# Patient Record
Sex: Female | Born: 1940 | Race: White | Hispanic: No | Marital: Married | Smoking: Never smoker
Health system: Southern US, Community
[De-identification: ages and names within clinical notes are randomized; demographics above are authoritative.]

## PROBLEM LIST (undated history)

## (undated) DIAGNOSIS — I10 Essential (primary) hypertension: Secondary | ICD-10-CM

## (undated) DIAGNOSIS — F419 Anxiety disorder, unspecified: Secondary | ICD-10-CM

## (undated) DIAGNOSIS — R011 Cardiac murmur, unspecified: Secondary | ICD-10-CM

## (undated) DIAGNOSIS — R42 Dizziness and giddiness: Secondary | ICD-10-CM

## (undated) DIAGNOSIS — IMO0001 Reserved for inherently not codable concepts without codable children: Secondary | ICD-10-CM

## (undated) DIAGNOSIS — G4730 Sleep apnea, unspecified: Secondary | ICD-10-CM

## (undated) DIAGNOSIS — A389 Scarlet fever, uncomplicated: Secondary | ICD-10-CM

## (undated) DIAGNOSIS — K219 Gastro-esophageal reflux disease without esophagitis: Secondary | ICD-10-CM

## (undated) DIAGNOSIS — I8390 Asymptomatic varicose veins of unspecified lower extremity: Secondary | ICD-10-CM

## (undated) DIAGNOSIS — Z5189 Encounter for other specified aftercare: Secondary | ICD-10-CM

## (undated) DIAGNOSIS — E7800 Pure hypercholesterolemia, unspecified: Secondary | ICD-10-CM

## (undated) DIAGNOSIS — M1990 Unspecified osteoarthritis, unspecified site: Secondary | ICD-10-CM

## (undated) HISTORY — PX: COLONOSCOPY WITH ESOPHAGOGASTRODUODENOSCOPY (EGD): SHX5779

---

## 1960-01-27 HISTORY — PX: OTHER SURGICAL HISTORY: SHX169

## 1982-01-26 HISTORY — PX: ABDOMINAL HYSTERECTOMY: SHX81

## 1998-02-18 ENCOUNTER — Other Ambulatory Visit: Admission: RE | Admit: 1998-02-18 | Discharge: 1998-02-18 | Payer: Self-pay | Admitting: Obstetrics and Gynecology

## 1999-06-02 ENCOUNTER — Other Ambulatory Visit: Admission: RE | Admit: 1999-06-02 | Discharge: 1999-06-02 | Payer: Self-pay | Admitting: Obstetrics and Gynecology

## 2000-09-20 ENCOUNTER — Other Ambulatory Visit: Admission: RE | Admit: 2000-09-20 | Discharge: 2000-09-20 | Payer: Self-pay | Admitting: Obstetrics and Gynecology

## 2000-12-28 ENCOUNTER — Ambulatory Visit (HOSPITAL_COMMUNITY): Admission: RE | Admit: 2000-12-28 | Discharge: 2000-12-28 | Payer: Self-pay | Admitting: Gastroenterology

## 2005-07-22 ENCOUNTER — Encounter: Admission: RE | Admit: 2005-07-22 | Discharge: 2005-07-22 | Payer: Self-pay | Admitting: Internal Medicine

## 2005-08-31 ENCOUNTER — Ambulatory Visit (HOSPITAL_COMMUNITY): Admission: RE | Admit: 2005-08-31 | Discharge: 2005-08-31 | Payer: Self-pay | Admitting: Internal Medicine

## 2005-09-01 ENCOUNTER — Encounter: Admission: RE | Admit: 2005-09-01 | Discharge: 2005-09-01 | Payer: Self-pay | Admitting: Internal Medicine

## 2010-04-27 HISTORY — PX: CERVICAL FUSION: SHX112

## 2010-04-29 ENCOUNTER — Telehealth: Payer: Self-pay | Admitting: Cardiology

## 2010-04-29 NOTE — Telephone Encounter (Signed)
WANTS TO KNOW DATES OF STRESS TEST AND ECHOGRAM

## 2010-04-30 ENCOUNTER — Telehealth: Payer: Self-pay | Admitting: *Deleted

## 2010-04-30 NOTE — Telephone Encounter (Signed)
Called wanting to know when she had her last Echo; and stress test. Echo was done in 04; and nuclear was done 05

## 2010-05-13 ENCOUNTER — Ambulatory Visit (HOSPITAL_COMMUNITY)
Admission: RE | Admit: 2010-05-13 | Discharge: 2010-05-13 | Disposition: A | Discharge status: Home / Self Care | Payer: Medicare Other | Source: Ambulatory Visit | Attending: Neurosurgery | Admitting: Neurosurgery

## 2010-05-13 ENCOUNTER — Encounter (HOSPITAL_COMMUNITY)
Admission: RE | Admit: 2010-05-13 | Discharge: 2010-05-13 | Disposition: A | Discharge status: Home / Self Care | Payer: Medicare Other | Source: Ambulatory Visit | Attending: Neurosurgery | Admitting: Neurosurgery

## 2010-05-13 ENCOUNTER — Other Ambulatory Visit (HOSPITAL_COMMUNITY): Payer: Self-pay | Admitting: Neurosurgery

## 2010-05-13 DIAGNOSIS — Z01811 Encounter for preprocedural respiratory examination: Secondary | ICD-10-CM

## 2010-05-13 DIAGNOSIS — Z01818 Encounter for other preprocedural examination: Secondary | ICD-10-CM | POA: Insufficient documentation

## 2010-05-13 DIAGNOSIS — Z01810 Encounter for preprocedural cardiovascular examination: Secondary | ICD-10-CM | POA: Insufficient documentation

## 2010-05-13 DIAGNOSIS — I517 Cardiomegaly: Secondary | ICD-10-CM | POA: Insufficient documentation

## 2010-05-13 DIAGNOSIS — Z01812 Encounter for preprocedural laboratory examination: Secondary | ICD-10-CM | POA: Insufficient documentation

## 2010-05-13 LAB — BASIC METABOLIC PANEL
BUN: 11 mg/dL (ref 6–23)
CO2: 28 mEq/L (ref 19–32)
Calcium: 9.9 mg/dL (ref 8.4–10.5)
Chloride: 104 mEq/L (ref 96–112)
Creatinine, Ser: 0.73 mg/dL (ref 0.4–1.2)
GFR calc Af Amer: >60 mL/min (ref >60)
GFR calc non Af Amer: >60 mL/min (ref >60)
Glucose, Bld: 93 mg/dL (ref 70–99)
Potassium: 4.4 mEq/L (ref 3.5–5.1)
Sodium: 139 mEq/L (ref 135–145)

## 2010-05-13 LAB — CBC
HCT: 38.4 % (ref 36.0–46.0)
Hemoglobin: 13 g/dL (ref 12.0–15.0)
MCH: 30.6 pg (ref 26.0–34.0)
MCHC: 33.9 g/dL (ref 30.0–36.0)
MCV: 90.4 fL (ref 78.0–100.0)
Platelets: 173 10*3/uL (ref 150–400)
RBC: 4.25 MIL/uL (ref 3.87–5.11)
RDW: 13.8 % (ref 11.5–15.5)
WBC: 7.9 10*3/uL (ref 4.0–10.5)

## 2010-05-13 LAB — SURGICAL PCR SCREEN
MRSA, PCR: NEGATIVE
Staphylococcus aureus: NEGATIVE

## 2010-05-21 ENCOUNTER — Inpatient Hospital Stay (HOSPITAL_COMMUNITY): Payer: Medicare Other

## 2010-05-21 ENCOUNTER — Inpatient Hospital Stay (HOSPITAL_COMMUNITY)
Admission: RE | Admit: 2010-05-21 | Discharge: 2010-05-22 | DRG: 472 | Disposition: A | Discharge status: Home / Self Care | Payer: Medicare Other | Source: Ambulatory Visit | Attending: Neurosurgery | Admitting: Neurosurgery

## 2010-05-21 DIAGNOSIS — M4716 Other spondylosis with myelopathy, lumbar region: Secondary | ICD-10-CM | POA: Diagnosis present

## 2010-05-21 DIAGNOSIS — J309 Allergic rhinitis, unspecified: Secondary | ICD-10-CM | POA: Diagnosis present

## 2010-05-21 DIAGNOSIS — M47812 Spondylosis without myelopathy or radiculopathy, cervical region: Principal | ICD-10-CM | POA: Diagnosis present

## 2010-05-21 DIAGNOSIS — M5030 Other cervical disc degeneration, unspecified cervical region: Secondary | ICD-10-CM | POA: Diagnosis present

## 2010-05-21 DIAGNOSIS — Q762 Congenital spondylolisthesis: Secondary | ICD-10-CM

## 2010-05-21 DIAGNOSIS — Z7982 Long term (current) use of aspirin: Secondary | ICD-10-CM

## 2010-05-21 DIAGNOSIS — Z79899 Other long term (current) drug therapy: Secondary | ICD-10-CM

## 2010-05-21 DIAGNOSIS — K219 Gastro-esophageal reflux disease without esophagitis: Secondary | ICD-10-CM | POA: Diagnosis present

## 2010-05-21 DIAGNOSIS — I10 Essential (primary) hypertension: Secondary | ICD-10-CM | POA: Diagnosis present

## 2010-05-24 NOTE — Op Note (Signed)
NAMEEMALY, BOSCHERT                 ACCOUNT NO.:  [id number]  MEDICAL RECORD NO.:  [id number]           PATIENT TYPE:  I  LOCATION:  3533                         FACILITY:  [facility]  PHYSICIAN:  Hewitt Shorts, M.D.DATE OF BIRTH:  17-Dec-1940  DATE OF PROCEDURE:  05/21/2010 DATE OF DISCHARGE:                              OPERATIVE REPORT   PREOPERATIVE DIAGNOSES: 1. Cervical spondylosis. 2. Cervical degenerative disk disease. 3. Cervical anterolisthesis. 4. Cervicalgia.  POSTOPERATIVE DIAGNOSES: 1. Cervical spondylosis. 2. Cervical degenerative disk disease. 3. Cervical anterolisthesis. 4. Cervicalgia.  PROCEDURE:  C4-5, C5-6, and C6-7 anterior cervical decompression arthrodesis with allograft and Tether cervical plating.  SURGEON:  Hewitt Shorts, MD  ASSISTANT:  Danae Orleans. Venetia Maxon, MD  ANESTHESIA:  General endotracheal.  INDICATIONS:  The patient is a 70 year old woman who presented with neck pain extending to the shoulders bilaterally.  She was found to have advanced degenerative disk disease and spondylosis at the C5-6 and C6-7 levels with disk space narrowing, ventral and dorsal spurring, but an anterolisthesis of C4-5 that was dynamic with flexion and extension. Decision was made to proceed with three-level anterior cervical decompression and stabilization.  PROCEDURE IN DETAIL:  The patient was brought to the operating room and placed in general endotracheal anesthesia.  The patient was placed in 10 pounds of Holter traction.  The neck was prepped with Betadine soap and solution, and draped in sterile fashion.  An oblique incision was made on the left side of the neck paralleling the anterior border of the left sternocleidomastoid.  The line of incision was then infiltrated with local anesthetic with epinephrine.  Incision was made and carried down through the subcutaneous tissue and platysma.  Bipolar cautery was used to maintain hemostasis.  Dissection was  then carried out through an avascular plane in the sternocleidomastoid, carotid artery, and jugular vein laterally and trachea and esophagus medially.  The ventral aspect of the vertebral column was identified and localized x-ray taken of the C4-5, C5-6, and C6-7 intervertebral disk space identified.  There was significant ventral spurring at the C5-6 and C6-7 levels.  This was removed using double-action rongeurs and the osteophyte removal tool. The disk spaces were quite narrowed at both C5-6 and C6-7 and the operating microscope was draped and brought in to the field to provide additional magnification, illumination, and visualization.  As we proceeded with diskectomy, there was little in the way of disk material at the C5-6 and C6-7 levels.  We began to remove the cartilaginous endplates at those levels using the micro curettes and high-speed drill and then posterior osteophytic overgrowth was removed using the high- speed drill and a 2-mm Kerrison punch with a thin footplate.  At C4-5, the diskectomy was performed using micro curettes and pituitary rongeurs.  The cartilaginous endplates were removed using micro curettes and the high-speed drill and posterior osteophytic overgrowth was removed using the high-speed drill and 2-mm Kerrison punch.  At each of the levels, the posterior longitudinal ligament, which was thickened and partially calcified was carefully removed, decompressed the spinal canal and thecal sac.  We then turned our attention to  the neural foramina to remove spondylotic encroachment of the neural foramina at each level bilaterally.  Once the decompression was completed, hemostasis was established with the use of Gelfoam soaked in thrombin, measured the height of the intervertebral disk space and selected three 5-mm in height allograft implants.  They were hydrated in saline solution and positioned in each of the intervertebral disk spaces and countersunk.  We  packed some Gelfoam lateral to each of the interbody implants and then we selected a 46-mm three-level Tether cervical plate.  This was positioned over the fusion construct and secured to the vertebra with 4 x 12-mm screws using a pair of fixed screws at C4, single fixed screws at C5 and C6, and a pair of variable screws at C7.  Each screw hole was started with a pilot hole and tapped as needed and then each of the screws was placed.  Once all 6 screws were in place, final tightening was performed.  The wound was irrigated with bacitracin solution.  Hemostasis was established and confirmed.  An x-ray was taken, showed the plates, grafts, screws all in position and the alignment was good, and then we proceeded with closure. Platysma was closed with interrupted inverted 2-0 undyed Vicryl sutures, subcutaneous and subcuticular were closed with interrupted inverted 3-0 undyed Vicryl sutures, skin was closed with Dermabond.  The procedure was tolerated well.  The estimated blood loss was 75 mL.  Sponge count was correct.  Following the surgery, the patient was taken out of traction, placed in a soft cervical collar to be reversed from the anesthetic, extubated, and transferred to the recovery room for further care.     Hewitt Shorts, M.D.     RWN/MEDQ  D:  05/21/2010  T:  05/22/2010  Job:  [id number]  Electronically Signed by Shirlean Kelly M.D. on 05/24/2010 08:07:08 AM

## 2011-01-30 ENCOUNTER — Other Ambulatory Visit: Payer: Self-pay | Admitting: Surgical

## 2011-01-30 NOTE — H&P (Signed)
Beth Burke  DOB: 1940-07-20  Date of Admission: 02/16/2011  Chief Complaint:  Right Knee Pain  History of Present Illness The patient is a 71 year old female who comes in today for a preoperative History and Physical. The patient is scheduled for a right total knee arthroplasty to be performed by Dr. Gus Rankin. Aluisio, MD at [facility] on February 16, 2011 . Please see the hospital record for complete dictated history and physical.  Allergies NKDA  Past Medical History Anxiety Disorder. currently on sertraline Gastroesophageal Reflux Disease. Prilosec prn Heart murmur High blood pressure. Benazepril and amlodipine Hypercholesterolemia. simvastatin Osteoarthritis Sleep Apnea. Diagnosed but not on CPAP Vertigo Varicose veins Right tibia fracture . age 61 Scarlet Fever. age 49  Past Surgical History Hysterectomy. partial (cancerous) Neck Disc Surgery im nailing rt tibia Spinal Fusion. neck  Family History Cancer. sister- salivary glands Congestive Heart Failure. mother Hypertension. mother and sister Osteoarthritis. mother Severe allergy. mother and sister Mother. Living. Father. Deceased. died in a car wreck at age 38 (was in good health) Brother 1. Deceased. age 62, myocardial infarction Children. Living. in good health  Social History Alcohol use. never consumed alcohol Children. 2 Current work status. working part time Drug/Alcohol Rehab (Currently). no Drug/Alcohol Rehab (Previously). no Exercise. Exercises rarely; does running / walking Illicit drug use. no Living situation. live with spouse Marital status. married Number of flights of stairs before winded. greater than 5 Pain Contract. no Tobacco / smoke exposure. no Tobacco use. Never smoker. never smoker Pregnant. no  Review of Systems General:Not Present- Chills, Fever, Night Sweats, Fatigue, Weight Loss and Memory Loss. Skin:Not Present- Hives,  Itching, Rash, Eczema and Lesions. HEENT:Not Present- Tinnitus, Headache, Double Vision, Visual Loss, Hearing Loss and Dentures. Respiratory:Not Present- Shortness of breath with exertion, Shortness of breath at rest, Allergies, Coughing up blood and Chronic Cough. Note:last CXR 04-2010 Cardiovascular:Present- Murmur. Not Present- Chest Pain, Racing/skipping heartbeats, Difficulty Breathing Lying Down, Swelling and Palpitations. Note:Last EKG 04-2010 Gastrointestinal:Not Present- Bloody Stool, Heartburn, Abdominal Pain, Vomiting, Nausea, Constipation, Diarrhea, Difficulty Swallowing, Jaundice and Loss of appetitie. Female Genitourinary:Not Present- Blood in Urine, Urinary frequency, Weak urinary stream, Discharge, Flank Pain, Incontinence, Painful Urination, Urgency, Urinary Retention and Urinating at Night. Musculoskeletal:Present- Joint Stiffness. Not Present- Muscle Weakness, Muscle Pain, Joint Swelling, Joint Pain, Back Pain, Morning Stiffness and Spasms. Neurological:Not Present- Tremor, Dizziness, Blackout spells, Paralysis, Difficulty with balance and Weakness. Psychiatric:Not Present- Insomnia.  Vitals Weight: 135 lb Height: 62 in Body Surface Area: 1.64 m Body Mass Index: 24.69 kg/m Pulse: 64 (Regular) Resp.: 16 (Unlabored) BP: 142/82 (Sitting, Left Arm, Standard)  Physical Exam The physical exam findings are as follows:  General Mental Status - Alert, cooperative and good historian. General Appearance- pleasant. Not in acute distress. Orientation- Oriented X3. Build & Nutrition- Well nourished and Well developed.  Head and Neck Head- normocephalic, atraumatic . Neck Global Assessment- supple. no bruit auscultated on the right and no bruit auscultated on the left.  Eye Pupil- Bilateral- PERRLA. Motion- Bilateral- EOMI.   Chest and Lung Exam Auscultation: Breath sounds:- clear at anterior chest wall and - clear at posterior chest  wall. Adventitious sounds:- No Adventitious sounds.  Cardiovascular Auscultation:Rhythm- Regular rate and rhythm. Heart Sounds- S1 WNL and S2 WNL. Murmurs & Other Heart Sounds:Auscultation of the heart reveals - Note: Faint aortic murmur on exam  Abdomen Palpation/Percussion:Tenderness- Abdomen is non-tender to palpation. Rigidity (guarding)- Abdomen is soft. Auscultation:Auscultation of the abdomen reveals - Bowel sounds normal.  Female Genitourinary  Not done, not pertinent to present illness  Musculoskeletal Right knee has baout 15 degrees varus deformity. Range is about 10 to 120. There is marked crepitus on range of motion. She is tender medial greater than lateral. There is no instability noted.  RADIOGRAPHS: X-rays show severe bone on bone change in the medial and patellofemoral compartments of her right knee.  Assessment & Plan Osteoarthritis Right  Knee (715.96)  At this point, the most predictable means of improving pain and function is total knee arthroplasty. The procedure, risks, potential complications and rehab course are discussed in detail and the patient elects to proceed.  The goals of this procedure are decreased pain and increased function. There is a high liklihood that both of these goals will be achieved.  Patient is for a Right Total Knee Arthroplasty by Dr. Lequita Halt.  Patient is planning to go home after surgery, where her daughter will be her caregiver.  Dimitri Ped, PA-C

## 2011-02-03 ENCOUNTER — Encounter (HOSPITAL_COMMUNITY): Payer: Self-pay | Admitting: Pharmacy Technician

## 2011-02-09 ENCOUNTER — Encounter (HOSPITAL_COMMUNITY): Payer: Self-pay

## 2011-02-09 ENCOUNTER — Encounter (HOSPITAL_COMMUNITY)
Admission: RE | Admit: 2011-02-09 | Discharge: 2011-02-09 | Disposition: A | Discharge status: Home / Self Care | Payer: Medicare Other | Source: Ambulatory Visit | Attending: Orthopedic Surgery | Admitting: Orthopedic Surgery

## 2011-02-09 HISTORY — DX: Gastro-esophageal reflux disease without esophagitis: K21.9

## 2011-02-09 HISTORY — DX: Anxiety disorder, unspecified: F41.9

## 2011-02-09 HISTORY — DX: Asymptomatic varicose veins of unspecified lower extremity: I83.90

## 2011-02-09 HISTORY — DX: Dizziness and giddiness: R42

## 2011-02-09 HISTORY — DX: Scarlet fever, uncomplicated: A38.9

## 2011-02-09 HISTORY — DX: Unspecified osteoarthritis, unspecified site: M19.90

## 2011-02-09 HISTORY — DX: Cardiac murmur, unspecified: R01.1

## 2011-02-09 HISTORY — DX: Sleep apnea, unspecified: G47.30

## 2011-02-09 HISTORY — DX: Essential (primary) hypertension: I10

## 2011-02-09 HISTORY — DX: Pure hypercholesterolemia, unspecified: E78.00

## 2011-02-09 LAB — URINALYSIS, ROUTINE W REFLEX MICROSCOPIC
Glucose, UA: NEGATIVE mg/dL
Protein, ur: NEGATIVE mg/dL
pH: 6.5 (ref 5.0–8.0)

## 2011-02-09 LAB — CBC
HCT: 36.9 % (ref 36.0–46.0)
MCH: 29.4 pg (ref 26.0–34.0)
MCV: 90.4 fL (ref 78.0–100.0)
Platelets: 170 10*3/uL (ref 150–400)
RDW: 13.6 % (ref 11.5–15.5)
WBC: 7.1 10*3/uL (ref 4.0–10.5)

## 2011-02-09 LAB — URINE MICROSCOPIC-ADD ON

## 2011-02-09 LAB — COMPREHENSIVE METABOLIC PANEL
ALT: 14 U/L (ref 0–35)
AST: 17 U/L (ref 0–37)
Alkaline Phosphatase: 45 U/L (ref 39–117)
CO2: 25 mEq/L (ref 19–32)
GFR calc Af Amer: 82 mL/min — ABNORMAL LOW (ref >90)
GFR calc non Af Amer: 71 mL/min — ABNORMAL LOW (ref >90)
Glucose, Bld: 94 mg/dL (ref 70–99)
Potassium: 4.6 mEq/L (ref 3.5–5.1)
Sodium: 141 mEq/L (ref 135–145)

## 2011-02-09 LAB — SURGICAL PCR SCREEN: MRSA, PCR: NEGATIVE

## 2011-02-09 NOTE — Patient Instructions (Addendum)
20 Beth Burke  02/09/2011   Your procedure is scheduled on:  02-16-2011  Report to [facility] Stay Center at 1000 AM.  Call this number if you have problems the morning of surgery: [phone]   Remember:   Do not eat food or drink liquids:After Midnight.  .  Take these medicines the morning of surgery with A SIP OF WATER: flonase nasal spray if needed, prilosec, zoloft, norvasc   Do not wear jewelry, make-up   Do not wear lotions, powders, or perfumes.    Do not bring valuables to the hospital.  Contacts, dentures or bridgework may not be worn into surgery.  Leave suitcase in the car. After surgery it may be brought to your room.  For patients admitted to the hospital, checkout time is 11:00 AM the day of discharge.     Special Instructions: CHG Shower Use Special Wash: 1/2 bottle night before surgery and 1/2 bottle morning of surgery.neck down avoid private area, no shaving 2 days before hibiclens showers   Please read over the following fact sheets that you were given: MRSA Information, blood fact sheet  Jasmine December Baker Kogler rn [facility] pre op nurse phone number call if needed

## 2011-02-09 NOTE — Pre-Procedure Instructions (Addendum)
ekg 02-03-2011 dr Waynard Edwards on chart Medical clearance note dr Waynard Edwards on chart Chest xray 05-13-2010 in epic

## 2011-02-16 ENCOUNTER — Inpatient Hospital Stay (HOSPITAL_COMMUNITY)
Admission: RE | Admit: 2011-02-16 | Discharge: 2011-02-19 | DRG: 470 | Disposition: A | Discharge status: Home Health | Payer: Medicare Other | Source: Ambulatory Visit | Attending: Orthopedic Surgery | Admitting: Orthopedic Surgery

## 2011-02-16 ENCOUNTER — Inpatient Hospital Stay (HOSPITAL_COMMUNITY): Payer: Medicare Other | Admitting: *Deleted

## 2011-02-16 ENCOUNTER — Encounter (HOSPITAL_COMMUNITY)
Admission: RE | Disposition: A | Discharge status: Home Health | Payer: Self-pay | Source: Ambulatory Visit | Attending: Orthopedic Surgery

## 2011-02-16 ENCOUNTER — Encounter (HOSPITAL_COMMUNITY): Payer: Self-pay | Admitting: *Deleted

## 2011-02-16 DIAGNOSIS — K219 Gastro-esophageal reflux disease without esophagitis: Secondary | ICD-10-CM | POA: Diagnosis present

## 2011-02-16 DIAGNOSIS — R32 Unspecified urinary incontinence: Secondary | ICD-10-CM | POA: Diagnosis present

## 2011-02-16 DIAGNOSIS — E7800 Pure hypercholesterolemia, unspecified: Secondary | ICD-10-CM | POA: Diagnosis present

## 2011-02-16 DIAGNOSIS — Z9289 Personal history of other medical treatment: Secondary | ICD-10-CM

## 2011-02-16 DIAGNOSIS — IMO0002 Reserved for concepts with insufficient information to code with codable children: Principal | ICD-10-CM | POA: Diagnosis present

## 2011-02-16 DIAGNOSIS — M1710 Unilateral primary osteoarthritis, unspecified knee: Secondary | ICD-10-CM | POA: Diagnosis present

## 2011-02-16 DIAGNOSIS — Z01810 Encounter for preprocedural cardiovascular examination: Secondary | ICD-10-CM

## 2011-02-16 DIAGNOSIS — M21169 Varus deformity, not elsewhere classified, unspecified knee: Secondary | ICD-10-CM | POA: Diagnosis present

## 2011-02-16 DIAGNOSIS — F411 Generalized anxiety disorder: Secondary | ICD-10-CM | POA: Diagnosis present

## 2011-02-16 DIAGNOSIS — E876 Hypokalemia: Secondary | ICD-10-CM | POA: Diagnosis not present

## 2011-02-16 DIAGNOSIS — Z01812 Encounter for preprocedural laboratory examination: Secondary | ICD-10-CM

## 2011-02-16 DIAGNOSIS — Z01818 Encounter for other preprocedural examination: Secondary | ICD-10-CM

## 2011-02-16 DIAGNOSIS — R011 Cardiac murmur, unspecified: Secondary | ICD-10-CM | POA: Diagnosis present

## 2011-02-16 DIAGNOSIS — Z96659 Presence of unspecified artificial knee joint: Secondary | ICD-10-CM

## 2011-02-16 DIAGNOSIS — I10 Essential (primary) hypertension: Secondary | ICD-10-CM | POA: Diagnosis present

## 2011-02-16 DIAGNOSIS — I8390 Asymptomatic varicose veins of unspecified lower extremity: Secondary | ICD-10-CM | POA: Diagnosis present

## 2011-02-16 DIAGNOSIS — D62 Acute posthemorrhagic anemia: Secondary | ICD-10-CM | POA: Diagnosis not present

## 2011-02-16 DIAGNOSIS — G4730 Sleep apnea, unspecified: Secondary | ICD-10-CM | POA: Diagnosis present

## 2011-02-16 HISTORY — PX: TOTAL KNEE ARTHROPLASTY: SHX125

## 2011-02-16 LAB — TYPE AND SCREEN
ABO/RH(D): A POS
Antibody Screen: NEGATIVE
Unit division: 0

## 2011-02-16 LAB — ABO/RH: ABO/RH(D): A POS

## 2011-02-16 SURGERY — ARTHROPLASTY, KNEE, TOTAL
Anesthesia: General | Site: Knee | Laterality: Right | Wound class: Clean

## 2011-02-16 MED ORDER — BUPIVACAINE 0.25 % ON-Q PUMP SINGLE CATH 300ML
300.0000 mL | INJECTION | Status: DC
  Filled 2011-02-16: qty 300

## 2011-02-16 MED ORDER — PROPOFOL 10 MG/ML IV EMUL
INTRAVENOUS | Status: DC | PRN
  Administered 2011-02-16: 100 ug/kg/min via INTRAVENOUS

## 2011-02-16 MED ORDER — METHOCARBAMOL 500 MG PO TABS
500.0000 mg | ORAL_TABLET | Freq: Four times a day (QID) | ORAL | Status: DC | PRN
  Administered 2011-02-16 – 2011-02-18 (×3): 500 mg via ORAL
  Filled 2011-02-16 (×3): qty 1

## 2011-02-16 MED ORDER — METOCLOPRAMIDE HCL 5 MG/ML IJ SOLN: 5.0000 mg | Freq: Three times a day (TID) | INTRAMUSCULAR | Status: DC | PRN

## 2011-02-16 MED ORDER — FLEET ENEMA 7-19 GM/118ML RE ENEM: 1.0000 | ENEMA | Freq: Once | RECTAL | Status: AC | PRN

## 2011-02-16 MED ORDER — ACETAMINOPHEN 10 MG/ML IV SOLN
1000.0000 mg | Freq: Four times a day (QID) | INTRAVENOUS | Status: AC
  Administered 2011-02-16 – 2011-02-17 (×4): 1000 mg via INTRAVENOUS
  Filled 2011-02-16 (×5): qty 100

## 2011-02-16 MED ORDER — POLYVINYL ALCOHOL 1.4 % OP SOLN
1.0000 [drp] | Freq: Every evening | OPHTHALMIC | Status: DC
  Administered 2011-02-18: 1 [drp] via OPHTHALMIC
  Filled 2011-02-16: qty 15

## 2011-02-16 MED ORDER — ONDANSETRON HCL 4 MG/2ML IJ SOLN: 4.0000 mg | Freq: Four times a day (QID) | INTRAMUSCULAR | Status: DC | PRN

## 2011-02-16 MED ORDER — RIVAROXABAN 10 MG PO TABS
10.0000 mg | ORAL_TABLET | Freq: Every day | ORAL | Status: DC
  Administered 2011-02-17 – 2011-02-19 (×3): 10 mg via ORAL
  Filled 2011-02-16 (×3): qty 1

## 2011-02-16 MED ORDER — MORPHINE SULFATE (PF) 1 MG/ML IV SOLN
INTRAVENOUS | Status: DC
  Administered 2011-02-16: 15:00:00 via INTRAVENOUS
  Administered 2011-02-16: 1 mg via INTRAVENOUS
  Administered 2011-02-16: 6 mg via INTRAVENOUS
  Administered 2011-02-17: 1 mg via INTRAVENOUS
  Filled 2011-02-16: qty 25

## 2011-02-16 MED ORDER — SODIUM CHLORIDE 0.9 % IJ SOLN: 9.0000 mL | INTRAMUSCULAR | Status: DC | PRN

## 2011-02-16 MED ORDER — ACETAMINOPHEN 325 MG PO TABS: 650.0000 mg | ORAL_TABLET | Freq: Four times a day (QID) | ORAL | Status: DC | PRN

## 2011-02-16 MED ORDER — HYPROMELLOSE (GONIOSCOPIC) 2.5 % OP SOLN: 1.0000 [drp] | Freq: Every evening | OPHTHALMIC | Status: DC

## 2011-02-16 MED ORDER — LACTATED RINGERS IV SOLN
INTRAVENOUS | Status: DC
  Administered 2011-02-16 (×3): via INTRAVENOUS
  Administered 2011-02-16: 1000 mL via INTRAVENOUS

## 2011-02-16 MED ORDER — POLYETHYLENE GLYCOL 3350 17 G PO PACK
17.0000 g | PACK | Freq: Every day | ORAL | Status: DC | PRN
  Filled 2011-02-16: qty 1

## 2011-02-16 MED ORDER — SODIUM CHLORIDE 0.9 % IR SOLN
Status: DC | PRN
  Administered 2011-02-16: 1000 mL

## 2011-02-16 MED ORDER — PROMETHAZINE HCL 25 MG/ML IJ SOLN: 6.2500 mg | INTRAMUSCULAR | Status: DC | PRN

## 2011-02-16 MED ORDER — BUPIVACAINE ON-Q PAIN PUMP (FOR ORDER SET NO CHG)
INJECTION | Status: DC
  Filled 2011-02-16: qty 1

## 2011-02-16 MED ORDER — EPHEDRINE SULFATE 50 MG/ML IJ SOLN
INTRAMUSCULAR | Status: DC | PRN
  Administered 2011-02-16 (×2): 5 mg via INTRAVENOUS
  Administered 2011-02-16 (×2): 10 mg via INTRAVENOUS
  Administered 2011-02-16: 5 mg via INTRAVENOUS

## 2011-02-16 MED ORDER — CEFAZOLIN SODIUM 1-5 GM-% IV SOLN
1.0000 g | Freq: Four times a day (QID) | INTRAVENOUS | Status: AC
  Administered 2011-02-16 – 2011-02-17 (×3): 1 g via INTRAVENOUS
  Filled 2011-02-16 (×3): qty 50

## 2011-02-16 MED ORDER — ACETAMINOPHEN 10 MG/ML IV SOLN
INTRAVENOUS | Status: DC | PRN
  Administered 2011-02-16: 1000 mg via INTRAVENOUS

## 2011-02-16 MED ORDER — ONDANSETRON HCL 4 MG PO TABS: 4.0000 mg | ORAL_TABLET | Freq: Four times a day (QID) | ORAL | Status: DC | PRN

## 2011-02-16 MED ORDER — PANTOPRAZOLE SODIUM 40 MG PO TBEC
40.0000 mg | DELAYED_RELEASE_TABLET | Freq: Every day | ORAL | Status: DC
  Administered 2011-02-16 – 2011-02-17 (×2): 40 mg via ORAL
  Filled 2011-02-16 (×2): qty 1

## 2011-02-16 MED ORDER — DIPHENHYDRAMINE HCL 12.5 MG/5ML PO ELIX: 12.5000 mg | ORAL_SOLUTION | ORAL | Status: DC | PRN

## 2011-02-16 MED ORDER — LORATADINE 10 MG PO TABS
10.0000 mg | ORAL_TABLET | Freq: Every day | ORAL | Status: DC
  Administered 2011-02-16 – 2011-02-19 (×3): 10 mg via ORAL
  Filled 2011-02-16 (×4): qty 1

## 2011-02-16 MED ORDER — NALOXONE HCL 0.4 MG/ML IJ SOLN: 0.4000 mg | INTRAMUSCULAR | Status: DC | PRN

## 2011-02-16 MED ORDER — ONDANSETRON HCL 4 MG/2ML IJ SOLN
4.0000 mg | Freq: Four times a day (QID) | INTRAMUSCULAR | Status: DC | PRN
  Administered 2011-02-17: 4 mg via INTRAVENOUS
  Filled 2011-02-16: qty 2

## 2011-02-16 MED ORDER — METOCLOPRAMIDE HCL 10 MG PO TABS: 5.0000 mg | ORAL_TABLET | Freq: Three times a day (TID) | ORAL | Status: DC | PRN

## 2011-02-16 MED ORDER — DIPHENHYDRAMINE HCL 12.5 MG/5ML PO ELIX: 12.5000 mg | ORAL_SOLUTION | Freq: Four times a day (QID) | ORAL | Status: DC | PRN

## 2011-02-16 MED ORDER — DIPHENHYDRAMINE HCL 50 MG/ML IJ SOLN: 12.5000 mg | Freq: Four times a day (QID) | INTRAMUSCULAR | Status: DC | PRN

## 2011-02-16 MED ORDER — MIDAZOLAM HCL 5 MG/5ML IJ SOLN
INTRAMUSCULAR | Status: DC | PRN
  Administered 2011-02-16: 2 mg via INTRAVENOUS

## 2011-02-16 MED ORDER — CEFAZOLIN SODIUM 1-5 GM-% IV SOLN
1.0000 g | Freq: Once | INTRAVENOUS | Status: AC
  Administered 2011-02-16: 1 g via INTRAVENOUS

## 2011-02-16 MED ORDER — BUPIVACAINE 0.25 % ON-Q PUMP SINGLE CATH 300ML
INJECTION | Status: DC | PRN
  Administered 2011-02-16: 300 mL

## 2011-02-16 MED ORDER — SERTRALINE HCL 100 MG PO TABS
100.0000 mg | ORAL_TABLET | Freq: Every day | ORAL | Status: DC
  Administered 2011-02-17 – 2011-02-19 (×3): 100 mg via ORAL
  Filled 2011-02-16 (×3): qty 1

## 2011-02-16 MED ORDER — FLUTICASONE PROPIONATE 50 MCG/ACT NA SUSP: 2.0000 | Freq: Every day | NASAL | Status: DC | PRN

## 2011-02-16 MED ORDER — SODIUM CHLORIDE 0.9 % IV SOLN
INTRAVENOUS | Status: DC
  Administered 2011-02-16 – 2011-02-17 (×2): via INTRAVENOUS

## 2011-02-16 MED ORDER — DOCUSATE SODIUM 100 MG PO CAPS
100.0000 mg | ORAL_CAPSULE | Freq: Two times a day (BID) | ORAL | Status: DC
  Administered 2011-02-16 – 2011-02-19 (×6): 100 mg via ORAL
  Filled 2011-02-16 (×7): qty 1

## 2011-02-16 MED ORDER — SIMVASTATIN 10 MG PO TABS
10.0000 mg | ORAL_TABLET | Freq: Every day | ORAL | Status: DC
  Administered 2011-02-16 – 2011-02-18 (×3): 10 mg via ORAL
  Filled 2011-02-16 (×4): qty 1

## 2011-02-16 MED ORDER — AMLODIPINE BESYLATE 5 MG PO TABS
5.0000 mg | ORAL_TABLET | Freq: Every day | ORAL | Status: DC
  Administered 2011-02-17 – 2011-02-19 (×3): 5 mg via ORAL
  Filled 2011-02-16 (×3): qty 1

## 2011-02-16 MED ORDER — METHOCARBAMOL 100 MG/ML IJ SOLN
500.0000 mg | Freq: Four times a day (QID) | INTRAVENOUS | Status: DC | PRN
  Administered 2011-02-17: 500 mg via INTRAVENOUS
  Filled 2011-02-16 (×2): qty 5

## 2011-02-16 MED ORDER — TEMAZEPAM 15 MG PO CAPS: 15.0000 mg | ORAL_CAPSULE | Freq: Every evening | ORAL | Status: DC | PRN

## 2011-02-16 MED ORDER — MENTHOL 3 MG MT LOZG: 1.0000 | LOZENGE | OROMUCOSAL | Status: DC | PRN

## 2011-02-16 MED ORDER — HYDROMORPHONE HCL PF 1 MG/ML IJ SOLN: 0.2500 mg | INTRAMUSCULAR | Status: DC | PRN

## 2011-02-16 MED ORDER — ONDANSETRON HCL 4 MG/2ML IJ SOLN
INTRAMUSCULAR | Status: DC | PRN
  Administered 2011-02-16: 4 mg via INTRAVENOUS

## 2011-02-16 MED ORDER — PHENOL 1.4 % MT LIQD: 1.0000 | OROMUCOSAL | Status: DC | PRN

## 2011-02-16 MED ORDER — ACETAMINOPHEN 650 MG RE SUPP: 650.0000 mg | Freq: Four times a day (QID) | RECTAL | Status: DC | PRN

## 2011-02-16 MED ORDER — FENTANYL CITRATE 0.05 MG/ML IJ SOLN
INTRAMUSCULAR | Status: DC | PRN
  Administered 2011-02-16 (×2): 50 ug via INTRAVENOUS

## 2011-02-16 MED ORDER — BISACODYL 10 MG RE SUPP: 10.0000 mg | Freq: Every day | RECTAL | Status: DC | PRN

## 2011-02-16 MED ORDER — OXYCODONE HCL 5 MG PO TABS
5.0000 mg | ORAL_TABLET | ORAL | Status: DC | PRN
  Administered 2011-02-17 (×3): 10 mg via ORAL
  Administered 2011-02-17: 5 mg via ORAL
  Administered 2011-02-18 – 2011-02-19 (×4): 10 mg via ORAL
  Filled 2011-02-16 (×4): qty 2
  Filled 2011-02-16: qty 1
  Filled 2011-02-16 (×3): qty 2

## 2011-02-16 SURGICAL SUPPLY — 53 items
BAG SPEC THK2 15X12 ZIP CLS (MISCELLANEOUS) ×1
BAG ZIPLOCK 12X15 (MISCELLANEOUS) ×2 IMPLANT
BANDAGE ELASTIC 6 VELCRO ST LF (GAUZE/BANDAGES/DRESSINGS) ×2 IMPLANT
BANDAGE ESMARK 6X9 LF (GAUZE/BANDAGES/DRESSINGS) ×1 IMPLANT
BLADE SAG 18X100X1.27 (BLADE) ×2 IMPLANT
BLADE SAW SGTL 11.0X1.19X90.0M (BLADE) ×2 IMPLANT
BNDG CMPR 9X6 STRL LF SNTH (GAUZE/BANDAGES/DRESSINGS) ×1
BNDG ESMARK 6X9 LF (GAUZE/BANDAGES/DRESSINGS) ×2
BOWL SMART MIX CTS (DISPOSABLE) ×2 IMPLANT
CATH KIT ON-Q SILVERSOAK 5 (CATHETERS) ×1 IMPLANT
CATH KIT ON-Q SILVERSOAK 5IN (CATHETERS) ×2 IMPLANT
CEMENT HV SMART SET (Cement) ×3 IMPLANT
CLOTH BEACON ORANGE TIMEOUT ST (SAFETY) ×2 IMPLANT
CUFF TOURN SGL QUICK 34 (TOURNIQUET CUFF) ×2
CUFF TRNQT CYL 34X4X40X1 (TOURNIQUET CUFF) ×1 IMPLANT
DRAPE EXTREMITY T 121X128X90 (DRAPE) ×2 IMPLANT
DRAPE POUCH INSTRU U-SHP 10X18 (DRAPES) ×2 IMPLANT
DRAPE U-SHAPE 47X51 STRL (DRAPES) ×2 IMPLANT
DRSG ADAPTIC 3X8 NADH LF (GAUZE/BANDAGES/DRESSINGS) ×2 IMPLANT
DRSG PAD ABDOMINAL 8X10 ST (GAUZE/BANDAGES/DRESSINGS) ×1 IMPLANT
DURAPREP 26ML APPLICATOR (WOUND CARE) ×2 IMPLANT
ELECT REM PT RETURN 9FT ADLT (ELECTROSURGICAL) ×2
ELECTRODE REM PT RTRN 9FT ADLT (ELECTROSURGICAL) ×1 IMPLANT
EVACUATOR 1/8 PVC DRAIN (DRAIN) ×2 IMPLANT
FACESHIELD LNG OPTICON STERILE (SAFETY) ×10 IMPLANT
GLOVE BIO SURGEON STRL SZ7.5 (GLOVE) ×2 IMPLANT
GLOVE BIO SURGEON STRL SZ8 (GLOVE) ×2 IMPLANT
GLOVE BIOGEL PI IND STRL 8 (GLOVE) ×2 IMPLANT
GLOVE BIOGEL PI INDICATOR 8 (GLOVE) ×2
GOWN STRL NON-REIN LRG LVL3 (GOWN DISPOSABLE) ×2 IMPLANT
GOWN STRL REIN XL XLG (GOWN DISPOSABLE) ×2 IMPLANT
HANDPIECE INTERPULSE COAX TIP (DISPOSABLE) ×2
IMMOBILIZER KNEE 20 (SOFTGOODS) ×2
IMMOBILIZER KNEE 20 THIGH 36 (SOFTGOODS) ×1 IMPLANT
KIT BASIN OR (CUSTOM PROCEDURE TRAY) ×2 IMPLANT
MANIFOLD NEPTUNE II (INSTRUMENTS) ×2 IMPLANT
NS IRRIG 1000ML POUR BTL (IV SOLUTION) ×2 IMPLANT
PACK TOTAL JOINT (CUSTOM PROCEDURE TRAY) ×2 IMPLANT
PAD ABD 7.5X8 STRL (GAUZE/BANDAGES/DRESSINGS) ×2 IMPLANT
PADDING CAST COTTON 6X4 STRL (CAST SUPPLIES) ×6 IMPLANT
POSITIONER SURGICAL ARM (MISCELLANEOUS) ×2 IMPLANT
SET HNDPC FAN SPRY TIP SCT (DISPOSABLE) ×1 IMPLANT
SPONGE GAUZE 4X4 12PLY (GAUZE/BANDAGES/DRESSINGS) ×2 IMPLANT
STRIP CLOSURE SKIN 1/2X4 (GAUZE/BANDAGES/DRESSINGS) ×4 IMPLANT
SUCTION FRAZIER 12FR DISP (SUCTIONS) ×2 IMPLANT
SUT MNCRL AB 4-0 PS2 18 (SUTURE) ×2 IMPLANT
SUT PDS AB 1 CT1 27 (SUTURE) ×6 IMPLANT
SUT VIC AB 2-0 CT1 27 (SUTURE) ×6
SUT VIC AB 2-0 CT1 TAPERPNT 27 (SUTURE) ×3 IMPLANT
TOWEL OR 17X26 10 PK STRL BLUE (TOWEL DISPOSABLE) ×4 IMPLANT
TRAY FOLEY CATH 14FRSI W/METER (CATHETERS) ×2 IMPLANT
WATER STERILE IRR 1500ML POUR (IV SOLUTION) ×2 IMPLANT
WRAP KNEE MAXI GEL POST OP (GAUZE/BANDAGES/DRESSINGS) ×3 IMPLANT

## 2011-02-16 NOTE — Op Note (Signed)
Pre-operative diagnosis- Osteoarthritis  Right knee(s)  Post-operative diagnosis- Osteoarthritis Right knee(s)  Procedure-  Right  Total Knee Arthroplasty  Surgeon- Gus Rankin. Kathey Simer, MD  Assistant- Dimitri Ped, PA-C   Anesthesia-  Spinal EBL-* No blood loss amount entered *  Drains Hemovac  Tourniquet time-  Total Tourniquet Time Documented: Thigh (Right) - 35 minutes   Complications- None  Condition-PACU - hemodynamically stable.   Brief Clinical Note  Beth Burke is a 71 y.o. year old female with end stage OA of her right knee with progressively worsening pain and dysfunction. She has constant pain, with activity and at rest and significant functional deficits with difficulties even with ADLs. She has had extensive non-op management including analgesics, injections of cortisone and viscosupplements, and home exercise program, but remains in significant pain with significant dysfunction.Radiographs show bone on bone arthritis medial and patellofemoral with varus deformity and tibial subluxation. She presents now for left Total Knee Arthroplasty.    Procedure in detail---   The patient is brought into the operating room and positioned supine on the operating table. After successful administration of  Spinal,   a tourniquet is placed high on the  Right thigh(s) and the lower extremity is prepped and draped in the usual sterile fashion. Time out is performed by the operating team and then the  Right lower extremity is wrapped in Esmarch, knee flexed and the tourniquet inflated to 300 mmHg.       A midline incision is made with a ten blade through the subcutaneous tissue to the level of the extensor mechanism. A fresh blade is used to make a medial parapatellar arthrotomy. Soft tissue over the proximal medial tibia is subperiosteally elevated to the joint line with a knife and into the semimembranosus bursa with a Cobb elevator. Soft tissue over the proximal lateral tibia is elevated  with attention being paid to avoiding the patellar tendon on the tibial tubercle. The patella is everted, knee flexed 90 degrees and the ACL and PCL are removed. Findings are bone on bone medial and patellofemoral with large medial tibial osteophytes.        The drill is used to create a starting hole in the distal femur and the canal is thoroughly irrigated with sterile saline to remove the fatty contents. The 5 degree Right  valgus alignment guide is placed into the femoral canal and the distal femoral cutting block is pinned to remove 11 mm off the distal femur. Resection is made with an oscillating saw.      The tibia is subluxed forward and the menisci are removed. The extramedullary alignment guide is placed referencing proximally at the medial aspect of the tibial tubercle and distally along the second metatarsal axis and tibial crest. The block is pinned to remove 2mm off the more deficient medial  side. Resection is made with an oscillating saw. Size 2.5is the most appropriate size for the tibia and the proximal tibia is prepared with the modular drill and keel punch for that size.      The femoral sizing guide is placed and size 2.5 is most appropriate. Rotation is marked off the epicondylar axis and confirmed by creating a rectangular flexion gap at 90 degrees. The size 2.5 cutting block is pinned in this rotation and the anterior, posterior and chamfer cuts are made with the oscillating saw. The intercondylar block is then placed and that cut is made.      Trial size 2.5 tibial component, trial size 2.5 posterior  stabilized femur and a 12.5  mm posterior stabilized rotating platform insert trial is placed. Full extension is achieved with excellent varus/valgus and anterior/posterior balance throughout full range of motion. The patella is everted and thickness measured to be 22  mm. Free hand resection is taken to 12 mm, a 38 template is placed, lug holes are drilled, trial patella is placed, and it  tracks normally. Osteophytes are removed off the posterior femur with the trial in place. All trials are removed and the cut bone surfaces prepared with pulsatile lavage. Cement is mixed and once ready for implantation, the size 2.5 tibial implant, size  2.5 posterior stabilized femoral component, and the size 38 patella are cemented in place and the patella is held with the clamp. The trial insert is placed and the knee held in full extension. All extruded cement is removed and once the cement is hard the permanent 12.5 mm posterior stabilized rotating platform insert is placed into the tibial tray.      The wound is copiously irrigated with saline solution and the extensor mechanism closed over a hemovac drain with #1 PDS suture. The tourniquet is released for a total tourniquet time of 35  minutes. Flexion against gravity is 140 degrees and the patella tracks normally. Subcutaneous tissue is closed with 2.0 vicryl and subcuticular with running 4.0 Monocryl. The catheter for the Marcaine pain pump is placed and the pump is initiated. The incision is cleaned and dried and steri-strips and a bulky sterile dressing are applied. The limb is placed into a knee immobilizer and the patient is awakened and transported to recovery in stable condition.      Please note that a surgical assistant was a medical necessity for this procedure in order to perform it in a safe and expeditious manner. Surgical assistant was necessary to retract the ligaments and vital neurovascular structures to prevent injury to them and also necessary for proper positioning of the limb to allow for anatomic placement of the prosthesis.   Gus Rankin Beth Defino, MD    02/16/2011, 2:03 PM

## 2011-02-16 NOTE — Plan of Care (Signed)
Problem: Consults Goal: Diagnosis- Total Joint Replacement Outcome: Completed/Met Date Met:  02/16/11 Primary Total Knee Right

## 2011-02-16 NOTE — Anesthesia Preprocedure Evaluation (Addendum)
Anesthesia Evaluation  Patient identified by MRN, date of birth, ID band Patient awake    Reviewed: Allergy & Precautions, H&P , NPO status , Patient's Chart, lab work & pertinent test results, reviewed documented beta blocker date and time   Airway Mallampati: II TM Distance: >3 FB Neck ROM: Full    Dental  (+) Teeth Intact   Pulmonary sleep apnea ,  No Rx indicated clear to auscultation        Cardiovascular hypertension, Pt. on medications Regular Normal Denies cardiac symptoms   Neuro/Psych Negative Neurological ROS  Negative Psych ROS   GI/Hepatic negative GI ROS, Neg liver ROS,   Endo/Other  Negative Endocrine ROS  Renal/GU negative Renal ROS  Genitourinary negative   Musculoskeletal negative musculoskeletal ROS (+)   Abdominal   Peds negative pediatric ROS (+)  Hematology negative hematology ROS (+)   Anesthesia Other Findings Upper left side bridge  Reproductive/Obstetrics negative OB ROS                           Anesthesia Physical Anesthesia Plan  ASA: II  Anesthesia Plan: Spinal   Post-op Pain Management:    Induction: Intravenous  Airway Management Planned: Mask  Additional Equipment:   Intra-op Plan:   Post-operative Plan:   Informed Consent: I have reviewed the patients History and Physical, chart, labs and discussed the procedure including the risks, benefits and alternatives for the proposed anesthesia with the patient or authorized representative who has indicated his/her understanding and acceptance.     Plan Discussed with: CRNA and Surgeon  Anesthesia Plan Comments:        Anesthesia Quick Evaluation

## 2011-02-16 NOTE — Anesthesia Postprocedure Evaluation (Signed)
  Anesthesia Post-op Note  Patient: Beth Burke  Procedure(s) Performed:  TOTAL KNEE ARTHROPLASTY  Patient Location: PACU  Anesthesia Type: Spinal  Level of Consciousness: oriented and sedated  Airway and Oxygen Therapy: Patient Spontanous Breathing and Patient connected to nasal cannula oxygen  Post-op Pain: none  Post-op Assessment: Post-op Vital signs reviewed, Patient's Cardiovascular Status Stable, Respiratory Function Stable and Patent Airway  Post-op Vital Signs: stable  Complications: No apparent anesthesia complications

## 2011-02-16 NOTE — Anesthesia Procedure Notes (Signed)
Spinal  Patient location during procedure: OR Start time: 02/16/2011 12:48 PM End time: 02/16/2011 12:55 PM Staffing Anesthesiologist: Lestine Box B Performed by: anesthesiologist  Preanesthetic Checklist Completed: patient identified, site marked, surgical consent, pre-op evaluation, timeout performed, IV checked, risks and benefits discussed and monitors and equipment checked Spinal Block Patient position: sitting Prep: Betadine and site prepped and draped Patient monitoring: heart rate, cardiac monitor, continuous pulse ox and blood pressure Approach: right paramedian Location: L3-4 Injection technique: single-shot Needle Needle type: Quincke  Needle gauge: 25 G Needle length: 10 cm Needle insertion depth: 3 cm Assessment Sensory level: T6 Additional Notes [id number], 2013-11

## 2011-02-16 NOTE — H&P (View-Only) (Signed)
Beth Burke  DOB: 01/31/1940  Date of Admission: 02/16/2011  Chief Complaint:  Right Knee Pain  History of Present Illness The patient is a 70 year old female who comes in today for a preoperative History and Physical. The patient is scheduled for a right total knee arthroplasty to be performed by Dr. Frank V. Aluisio, MD at [facility] on February 16, 2011 . Please see the hospital record for complete dictated history and physical.  Allergies NKDA  Past Medical History Anxiety Disorder. currently on sertraline Gastroesophageal Reflux Disease. Prilosec prn Heart murmur High blood pressure. Benazepril and amlodipine Hypercholesterolemia. simvastatin Osteoarthritis Sleep Apnea. Diagnosed but not on CPAP Vertigo Varicose veins Right tibia fracture . age 19 Scarlet Fever. age 13  Past Surgical History Hysterectomy. partial (cancerous) Neck Disc Surgery im nailing rt tibia Spinal Fusion. neck  Family History Cancer. sister- salivary glands Congestive Heart Failure. mother Hypertension. mother and sister Osteoarthritis. mother Severe allergy. mother and sister Mother. Living. Father. Deceased. died in a car wreck at age 43 (was in good health) Brother 1. Deceased. age 63, myocardial infarction Children. Living. in good health  Social History Alcohol use. never consumed alcohol Children. 2 Current work status. working part time Drug/Alcohol Rehab (Currently). no Drug/Alcohol Rehab (Previously). no Exercise. Exercises rarely; does running / walking Illicit drug use. no Living situation. live with spouse Marital status. married Number of flights of stairs before winded. greater than 5 Pain Contract. no Tobacco / smoke exposure. no Tobacco use. Never smoker. never smoker Pregnant. no  Review of Systems General:Not Present- Chills, Fever, Night Sweats, Fatigue, Weight Loss and Memory Loss. Skin:Not Present- Hives,  Itching, Rash, Eczema and Lesions. HEENT:Not Present- Tinnitus, Headache, Double Vision, Visual Loss, Hearing Loss and Dentures. Respiratory:Not Present- Shortness of breath with exertion, Shortness of breath at rest, Allergies, Coughing up blood and Chronic Cough. Note:last CXR 04-2010 Cardiovascular:Present- Murmur. Not Present- Chest Pain, Racing/skipping heartbeats, Difficulty Breathing Lying Down, Swelling and Palpitations. Note:Last EKG 04-2010 Gastrointestinal:Not Present- Bloody Stool, Heartburn, Abdominal Pain, Vomiting, Nausea, Constipation, Diarrhea, Difficulty Swallowing, Jaundice and Loss of appetitie. Female Genitourinary:Not Present- Blood in Urine, Urinary frequency, Weak urinary stream, Discharge, Flank Pain, Incontinence, Painful Urination, Urgency, Urinary Retention and Urinating at Night. Musculoskeletal:Present- Joint Stiffness. Not Present- Muscle Weakness, Muscle Pain, Joint Swelling, Joint Pain, Back Pain, Morning Stiffness and Spasms. Neurological:Not Present- Tremor, Dizziness, Blackout spells, Paralysis, Difficulty with balance and Weakness. Psychiatric:Not Present- Insomnia.  Vitals Weight: 135 lb Height: 62 in Body Surface Area: 1.64 m Body Mass Index: 24.69 kg/m Pulse: 64 (Regular) Resp.: 16 (Unlabored) BP: 142/82 (Sitting, Left Arm, Standard)  Physical Exam The physical exam findings are as follows:  General Mental Status - Alert, cooperative and good historian. General Appearance- pleasant. Not in acute distress. Orientation- Oriented X3. Build & Nutrition- Well nourished and Well developed.  Head and Neck Head- normocephalic, atraumatic . Neck Global Assessment- supple. no bruit auscultated on the right and no bruit auscultated on the left.  Eye Pupil- Bilateral- PERRLA. Motion- Bilateral- EOMI.   Chest and Lung Exam Auscultation: Breath sounds:- clear at anterior chest wall and - clear at posterior chest  wall. Adventitious sounds:- No Adventitious sounds.  Cardiovascular Auscultation:Rhythm- Regular rate and rhythm. Heart Sounds- S1 WNL and S2 WNL. Murmurs & Other Heart Sounds:Auscultation of the heart reveals - Note: Faint aortic murmur on exam  Abdomen Palpation/Percussion:Tenderness- Abdomen is non-tender to palpation. Rigidity (guarding)- Abdomen is soft. Auscultation:Auscultation of the abdomen reveals - Bowel sounds normal.  Female Genitourinary   Not done, not pertinent to present illness  Musculoskeletal Right knee has baout 15 degrees varus deformity. Range is about 10 to 120. There is marked crepitus on range of motion. She is tender medial greater than lateral. There is no instability noted.  RADIOGRAPHS: X-rays show severe bone on bone change in the medial and patellofemoral compartments of her right knee.  Assessment & Plan Osteoarthritis Right  Knee (715.96)  At this point, the most predictable means of improving pain and function is total knee arthroplasty. The procedure, risks, potential complications and rehab course are discussed in detail and the patient elects to proceed.  The goals of this procedure are decreased pain and increased function. There is a high liklihood that both of these goals will be achieved.  Patient is for a Right Total Knee Arthroplasty by Dr. Aluisio.  Patient is planning to go home after surgery, where her daughter will be her caregiver.  Beth Edelson, PA-C  

## 2011-02-16 NOTE — Transfer of Care (Signed)
Immediate Anesthesia Transfer of Care Note  Patient: Beth Burke  Procedure(s) Performed:  TOTAL KNEE ARTHROPLASTY  Patient Location: PACU  Anesthesia Type: MAC combined with regional for post-op pain  Level of Consciousness: awake, alert  and oriented  Airway & Oxygen Therapy: Patient Spontanous Breathing and Patient connected to face mask oxygen  Post-op Assessment: Report given to PACU RN and Post -op Vital signs reviewed and stable  Post vital signs: Reviewed and stable  Complications: No apparent anesthesia complications

## 2011-02-16 NOTE — Preoperative (Signed)
Beta Blockers   Reason not to administer Beta Blockers:Not Applicable 

## 2011-02-16 NOTE — Progress Notes (Signed)
Utilization review completed.  

## 2011-02-16 NOTE — Interval H&P Note (Signed)
History and Physical Interval Note:  02/16/2011 12:29 PM  Beth Burke  has presented today for surgery, with the diagnosis of Osteoarthritis of the Right Knee  The various methods of treatment have been discussed with the patient and family. After consideration of risks, benefits and other options for treatment, the patient has consented to  Procedure(s): TOTAL KNEE ARTHROPLASTY as a surgical intervention .  The patients' history has been reviewed, patient examined, no change in status, stable for surgery.  I have reviewed the patients' chart and labs.  Questions were answered to the patient's satisfaction.     Loanne Drilling

## 2011-02-17 ENCOUNTER — Encounter (HOSPITAL_COMMUNITY): Payer: Self-pay | Admitting: Orthopedic Surgery

## 2011-02-17 DIAGNOSIS — D62 Acute posthemorrhagic anemia: Secondary | ICD-10-CM | POA: Diagnosis not present

## 2011-02-17 LAB — BASIC METABOLIC PANEL
GFR calc non Af Amer: 90 mL/min — ABNORMAL LOW (ref >90)
Glucose, Bld: 132 mg/dL — ABNORMAL HIGH (ref 70–99)
Potassium: 3.6 mEq/L (ref 3.5–5.1)
Sodium: 136 mEq/L (ref 135–145)

## 2011-02-17 LAB — CBC
Hemoglobin: 9.3 g/dL — ABNORMAL LOW (ref 12.0–15.0)
MCHC: 33.3 g/dL (ref 30.0–36.0)
RBC: 3.13 MIL/uL — ABNORMAL LOW (ref 3.87–5.11)

## 2011-02-17 MED ORDER — POLYSACCHARIDE IRON 150 MG PO CAPS
150.0000 mg | ORAL_CAPSULE | Freq: Every day | ORAL | Status: DC
  Administered 2011-02-17 – 2011-02-19 (×3): 150 mg via ORAL
  Filled 2011-02-17 (×4): qty 1

## 2011-02-17 MED ORDER — NON FORMULARY: 20.0000 mg | Freq: Every day | Status: DC

## 2011-02-17 MED ORDER — MORPHINE SULFATE 2 MG/ML IJ SOLN: 1.0000 mg | INTRAMUSCULAR | Status: DC | PRN

## 2011-02-17 MED ORDER — OMEPRAZOLE 20 MG PO CPDR
20.0000 mg | DELAYED_RELEASE_CAPSULE | Freq: Every day | ORAL | Status: DC
  Administered 2011-02-18 – 2011-02-19 (×2): 20 mg via ORAL
  Filled 2011-02-17 (×3): qty 1

## 2011-02-17 NOTE — Evaluation (Signed)
Physical Therapy Evaluation Patient Details Name: Beth Burke MRN: [id number] DOB: [birth year] Today's Date: 02/17/2011  [id number] EVII  Problem List:  Patient Active Problem List  Diagnoses  . OA (osteoarthritis) of knee  . Postop Acute blood loss anemia    Past Medical History:  Past Medical History  Diagnosis Date  . Hypertension   . Heart murmur   . Varicose veins   . Elevated cholesterol   . GERD (gastroesophageal reflux disease)   . Vertigo   . Anxiety   . 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 Scarlet fever as child  . Arthritis   . Urinary incontinence     wears pads   Past Surgical History:  Past Surgical History  Procedure Date  . Cervical fusion april 2012    C5 to C7 with plates  . Right lower leg surgery 1962    wire placed for crushed leg  . Abdominal hysterectomy 1984    PT Assessment/Plan/Recommendation PT Assessment Clinical Impression Statement: Pt presents s/p R TKR POD 1 with decreased strength and mobility.  Pt ambulated 50', however with c/o nausea at end of ambulation.  BP was 121/76 upon sitting and pt stated that she felt somewhat better sitting.  RN notified.  Pt would benefit from PT in the acute venue to address deficits.  PT recommends HHPT for pt at D/C for follow up therapy.    PT Recommendation/Assessment: Patient will need skilled PT in the acute care venue PT Problem List: Decreased strength;Decreased range of motion;Decreased activity tolerance;Decreased mobility;Decreased safety awareness;Decreased knowledge of use of DME;Pain Barriers to Discharge: None PT Therapy Diagnosis : Difficulty walking;Abnormality of gait;Generalized weakness;Acute pain PT Plan PT Frequency: 7X/week PT Treatment/Interventions: DME instruction;Gait training;Stair training;Functional mobility training;Therapeutic activities;Therapeutic exercise;Patient/family education PT Recommendation Follow Up Recommendations: Home health PT Equipment  Recommended: Rolling walker with 5" wheels PT Goals  Acute Rehab PT Goals PT Goal Formulation: With patient Time For Goal Achievement: 7 days Pt will go Supine/Side to Sit: with supervision PT Goal: Supine/Side to Sit - Progress: Goal set today Pt will go Sit to Supine/Side: with supervision PT Goal: Sit to Supine/Side - Progress: Goal set today Pt will go Sit to Stand: with supervision PT Goal: Sit to Stand - Progress: Goal set today Pt will go Stand to Sit: with supervision PT Goal: Stand to Sit - Progress: Goal set today Pt will Ambulate: 51 - 150 feet;with supervision;with least restrictive assistive device PT Goal: Ambulate - Progress: Goal set today Pt will Go Up / Down Stairs: 1-2 stairs;with min assist;with least restrictive assistive device PT Goal: Up/Down Stairs - Progress: Goal set today Pt will Perform Home Exercise Program: with supervision, verbal cues required/provided PT Goal: Perform Home Exercise Program - Progress: Goal set today  PT Evaluation Precautions/Restrictions  Precautions Precautions: Knee Required Braces or Orthoses: Yes Knee Immobilizer: Discontinue once straight leg raise with < 10 degree lag Restrictions Weight Bearing Restrictions: Yes RLE Weight Bearing: Weight bearing as tolerated Prior Functioning  Home Living Lives With: Spouse Receives Help From: Family Type of Home: House Home Layout: One level Home Access: Stairs to enter Entrance Stairs-Rails: Can reach both Entrance Stairs-Number of Steps: 2 Home Adaptive Equipment: None Prior Function Level of Independence: Independent with basic ADLs;Independent with gait;Independent with transfers Driving: Yes Vocation: Part time employment Cognition Cognition Arousal/Alertness: Awake/alert Overall Cognitive Status: Appears within functional limits for tasks assessed Orientation Level: Oriented X4 Sensation/Coordination Sensation Light Touch: Appears Intact  Coordination Gross Motor  Movements are Fluid and Coordinated: Yes Extremity Assessment RLE Assessment RLE Assessment: Exceptions to [facility] RLE Strength RLE Overall Strength Comments: ankle motions WFL, unable to perform SLR.  LLE Assessment LLE Assessment: Within Functional Limits Mobility (including Balance) Bed Mobility Bed Mobility: Yes Supine to Sit: 4: Min assist;3: Mod assist Supine to Sit Details (indicate cue type and reason): Assist with R LE and some assist with trunk with cues for UE to assist trunk.   Transfers Transfers: Yes Sit to Stand: 4: Min assist;3: Mod assist;With upper extremity assist;From bed Sit to Stand Details (indicate cue type and reason): Min/mod assist for forward weight shift with cues for hand placement and RLE managment.  Stand to Sit: 3: Mod assist Stand to Sit Details: Assist for controlled descent at trunk and assist with RLE with cues for hand placement.   Ambulation/Gait Ambulation/Gait: Yes Ambulation/Gait Assistance: 4: Min assist Ambulation/Gait Assistance Details (indicate cue type and reason): Requires cues for sequencing/technique with tactile cuing for RW placement due to pt tendency to step too far inside RW.   Ambulation Distance (Feet): 50 Feet Assistive device: Rolling walker Gait Pattern: Step-to pattern;Decreased stance time - right;Decreased step length - left Gait velocity: decreased Stairs: No Wheelchair Mobility Wheelchair Mobility: No    Exercise    End of Session PT - End of Session Equipment Utilized During Treatment: Gait belt;Right knee immobilizer Activity Tolerance: Patient limited by fatigue;Other (comment) (Pt limited by nausea w/ ambulation. ) Patient left: in chair;with call bell in reach;with family/visitor present Nurse Communication: Mobility status for transfers;Mobility status for ambulation (RN notified of nausea. ) General Behavior During Session: [facility] for tasks performed Cognition: [facility] for tasks performed  Page, Meribeth Mattes 02/17/2011, 11:50 AM

## 2011-02-17 NOTE — Progress Notes (Signed)
Subjective: 1 Day Post-Op Procedure(s) (LRB): TOTAL KNEE ARTHROPLASTY (Right) Patient reports pain as mild.   Patient seen in rounds with Dr. Lequita Halt. Patient with not much sleep. Family in room. We will start therapy today. Plan is to go home after hospital stay.  Objective: Vital signs in last 24 hours: Temp:  [96.1 F (35.6 C)-99.2 F (37.3 C)] 99.2 F (37.3 C) (01/22 0510) Pulse Rate:  [58-85] 82  (01/22 0510) Resp:  [11-18] 17  (01/22 0755) BP: (99-148)/(48-73) 106/65 mmHg (01/22 0510) SpO2:  [98 %-100 %] 98 % (01/22 0755) Weight:  [58.134 kg (128 lb 2.6 oz)] 58.134 kg (128 lb 2.6 oz) (01/21 1620)  Intake/Output from previous day:  Intake/Output Summary (Last 24 hours) at 02/17/11 0855 Last data filed at 02/17/11 0535  Gross per 24 hour  Intake   4040 ml  Output   2365 ml  Net   1675 ml    Intake/Output this shift: UOP 275  Labs:  Basename 02/17/11 0330  HGB 9.3*    Basename 02/17/11 0330  WBC 8.1  RBC 3.13*  HCT 27.9*  PLT 140*    Basename 02/17/11 0330  NA 136  K 3.6  CL 102  CO2 25  BUN 8  CREATININE 0.61  GLUCOSE 132*  CALCIUM 8.3*   No results found for this basename: LABPT:2,INR:2 in the last 72 hours  Exam - Neurovascular intact Sensation intact distally Dressing - clean, dry Motor function intact - moving foot and toes well on exam.  Hemovac pulled without difficulty.  Past Medical History  Diagnosis Date  . Hypertension   . Heart murmur   . Varicose veins   . Elevated cholesterol   . GERD (gastroesophageal reflux disease)   . Vertigo   . Anxiety   . 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 Scarlet fever as child  . Arthritis   . Urinary incontinence     wears pads    Assessment/Plan: 1 Day Post-Op Procedure(s) (LRB): TOTAL KNEE ARTHROPLASTY (Right) Principal Problem:  *OA (osteoarthritis) of knee Active Problems:  Postop Acute blood loss anemia   Advance diet Up with therapy Discharge home with home  health when met goals  DVT Prophylaxis - Xarelto Protocol Weight-Bearing as tolerated to right leg Keep foley until tomorrow. No vaccines. D/C PCA Morphine, Change to IV push D/C O2 and Pulse OX and try on Room [address]  Beth Burke 02/17/2011, 8:55 AM

## 2011-02-17 NOTE — Progress Notes (Signed)
Physical Therapy Treatment Patient Details Name: Beth Burke MRN: [id number] DOB: 11-06-1940 Today's Date: 02/17/2011  1253-129 G, TE  PT Assessment/Plan  PT - Assessment/Plan Comments on Treatment Session: Pt tolerated increased ambulation and performed exercises well, however continues to have some nausea with ambulation.  Pt states that nausea does not increase, but stays the same.  PT Plan: Discharge plan remains appropriate PT Frequency: 7X/week Follow Up Recommendations: Home health PT Equipment Recommended: Rolling walker with 5" wheels PT Goals  Acute Rehab PT Goals PT Goal Formulation: With patient Time For Goal Achievement: 7 days Pt will go Supine/Side to Sit: with supervision PT Goal: Supine/Side to Sit - Progress: Goal set today Pt will go Sit to Supine/Side: with supervision PT Goal: Sit to Supine/Side - Progress: Progressing toward goal Pt will go Sit to Stand: with supervision PT Goal: Sit to Stand - Progress: Progressing toward goal Pt will go Stand to Sit: with supervision PT Goal: Stand to Sit - Progress: Progressing toward goal Pt will Ambulate: 51 - 150 feet;with supervision;with least restrictive assistive device PT Goal: Ambulate - Progress: Progressing toward goal Pt will Go Up / Down Stairs: 1-2 stairs;with min assist;with least restrictive assistive device PT Goal: Up/Down Stairs - Progress: Goal set today Pt will Perform Home Exercise Program: with supervision, verbal cues required/provided PT Goal: Perform Home Exercise Program - Progress: Progressing toward goal  PT Treatment Precautions/Restrictions  Precautions Precautions: Knee Required Braces or Orthoses: Yes Knee Immobilizer: Discontinue once straight leg raise with < 10 degree lag Restrictions Weight Bearing Restrictions: Yes RLE Weight Bearing: Weight bearing as tolerated Mobility (including Balance) Bed Mobility Bed Mobility: Yes Supine to Sit: 4: Min assist;3: Mod assist Supine to  Sit Details (indicate cue type and reason): Assist with R LE and some assist with trunk with cues for UE to assist trunk.   Sit to Supine: 4: Min assist Sit to Supine - Details (indicate cue type and reason): Requires min A with RLE Transfers Transfers: Yes Sit to Stand: 4: Min assist Sit to Stand Details (indicate cue type and reason): min guard for safety with cues for hand placement Stand to Sit: 4: Min assist Stand to Sit Details: Min guard for controlled descent with cues for hand placement and RLE management.  Ambulation/Gait Ambulation/Gait: Yes Ambulation/Gait Assistance: 4: Min assist Ambulation/Gait Assistance Details (indicate cue type and reason): Requires cues for sequencing/technique Ambulation Distance (Feet): 130 Feet Assistive device: Rolling walker Gait Pattern: Step-to pattern;Decreased stance time - right;Decreased step length - left Gait velocity: decreased Stairs: No Wheelchair Mobility Wheelchair Mobility: No    Exercise  Total Joint Exercises Ankle Circles/Pumps: AROM;Both;20 reps;Seated Quad Sets: AROM;Right;10 reps;Seated Short Arc Quad: AAROM;Right;10 reps;Seated Heel Slides: AAROM;Right;10 reps;Seated Hip ABduction/ADduction: AAROM;Right;10 reps;Seated Straight Leg Raises: AAROM;Right;10 reps;Seated End of Session PT - End of Session Equipment Utilized During Treatment: Gait belt;Right knee immobilizer Activity Tolerance: Patient limited by fatigue;Other (comment) (Pt continues to complain of "wooziness" during ambulation. ) Patient left: in bed;with call bell in reach;with family/visitor present Nurse Communication: Mobility status for transfers;Mobility status for ambulation (RN notified of nausea. ) General Behavior During Session: [facility] for tasks performed Cognition: [facility] for tasks performed  Page, Meribeth Mattes 02/17/2011, 2:33 PM

## 2011-02-18 DIAGNOSIS — E876 Hypokalemia: Secondary | ICD-10-CM | POA: Diagnosis not present

## 2011-02-18 DIAGNOSIS — Z9289 Personal history of other medical treatment: Secondary | ICD-10-CM

## 2011-02-18 LAB — CBC
MCH: 30.1 pg (ref 26.0–34.0)
MCV: 89.5 fL (ref 78.0–100.0)
Platelets: 107 10*3/uL — ABNORMAL LOW (ref 150–400)
RDW: 13.8 % (ref 11.5–15.5)
WBC: 9.7 10*3/uL (ref 4.0–10.5)

## 2011-02-18 LAB — BASIC METABOLIC PANEL
Calcium: 8.1 mg/dL — ABNORMAL LOW (ref 8.4–10.5)
Creatinine, Ser: 0.64 mg/dL (ref 0.50–1.10)
GFR calc Af Amer: >90 mL/min (ref >90)
Sodium: 135 mEq/L (ref 135–145)

## 2011-02-18 LAB — PREPARE RBC (CROSSMATCH)

## 2011-02-18 MED ORDER — FUROSEMIDE 10 MG/ML IJ SOLN
10.0000 mg | Freq: Once | INTRAMUSCULAR | Status: AC
  Administered 2011-02-18: 10 mg via INTRAVENOUS
  Filled 2011-02-18: qty 1

## 2011-02-18 MED ORDER — ACETAMINOPHEN 325 MG PO TABS
650.0000 mg | ORAL_TABLET | Freq: Once | ORAL | Status: AC
  Administered 2011-02-18: 650 mg via ORAL
  Filled 2011-02-18: qty 2

## 2011-02-18 MED ORDER — ACETAMINOPHEN 10 MG/ML IV SOLN: 1000.0000 mg | Freq: Once | INTRAVENOUS | Status: DC

## 2011-02-18 MED ORDER — DIPHENHYDRAMINE HCL 25 MG PO CAPS
25.0000 mg | ORAL_CAPSULE | Freq: Once | ORAL | Status: AC
  Administered 2011-02-18: 25 mg via ORAL
  Filled 2011-02-18: qty 1

## 2011-02-18 NOTE — Progress Notes (Signed)
PT unable to see pt for second session in pm due to pt still receiving first unit of blood.  Will see again in am.   Thanks,  Clovia Cuff, PT

## 2011-02-18 NOTE — Progress Notes (Signed)
Subjective: 2 Days Post-Op Procedure(s) (LRB): TOTAL KNEE ARTHROPLASTY (Right) Patient reports pain as mild.   Patient seen in rounds by Dr. Lequita Halt. Patient's HGB down lower.  Will give blood today.  Objective: Vital signs in last 24 hours: Temp:  [98.2 F (36.8 C)-99.3 F (37.4 C)] 98.7 F (37.1 C) (01/23 1325) Pulse Rate:  [73-82] 74  (01/23 1325) Resp:  [14-16] 16  (01/23 1325) BP: (113-132)/(63-77) 125/74 mmHg (01/23 1325) SpO2:  [89 %-94 %] 89 % (01/23 1100)  Intake/Output from previous day:  Intake/Output Summary (Last 24 hours) at 02/18/11 1626 Last data filed at 02/18/11 1500  Gross per 24 hour  Intake   1530 ml  Output   2550 ml  Net  -1020 ml    Intake/Output this shift: Total I/O In: 930 [I.V.:250; Blood:680] Out: 1350 [Urine:1350]  Labs:  [facility] 02/18/11 0342 02/17/11 0330  HGB 8.0* 9.3*    Basename 02/18/11 0342 02/17/11 0330  WBC 9.7 8.1  RBC 2.66* 3.13*  HCT 23.8* 27.9*  PLT 107* 140*    Basename 02/18/11 0342 02/17/11 0330  NA 135 136  K 3.4* 3.6  CL 102 102  CO2 27 25  BUN 7 8  CREATININE 0.64 0.61  GLUCOSE 104* 132*  CALCIUM 8.1* 8.3*   No results found for this basename: LABPT:2,INR:2 in the last 72 hours  Exam - Neurovascular intact Sensation intact distally Dressing/Incision - clean, dry, no drainage Motor function intact - moving foot and toes well on exam.   Past Medical History  Diagnosis Date  . Hypertension   . Heart murmur   . Varicose veins   . Elevated cholesterol   . GERD (gastroesophageal reflux disease)   . Vertigo   . Anxiety   . 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 Scarlet fever as child  . Arthritis   . Urinary incontinence     wears pads    Assessment/Plan: 2 Days Post-Op Procedure(s) (LRB): TOTAL KNEE ARTHROPLASTY (Right) Principal Problem:  *OA (osteoarthritis) of knee Active Problems:  Postop Acute blood loss anemia  Postop Hypokalemia  Postop Transfusion history   Up with  therapy Plan for discharge tomorrow Discharge home with home health  DVT Prophylaxis - Xarelto  Protocol Weight-Bearing as tolerated to right leg  Darrell Hauk 02/18/2011, 4:26 PM

## 2011-02-18 NOTE — Progress Notes (Signed)
OT Cancellation Note  _x__Treatment cancelled today due to medical issues with patient which prohibited therapy. Noted pt's decreased hemoglobin. Receiving 1st unit of blood. Will check back 02/18/10.  ___ Treatment cancelled today due to patient receiving procedure or test   ___ Treatment cancelled today due to patient's refusal to participate   ___ Treatment cancelled today due to   Signature: Garrel Ridgel, OTR/L  Pager [phone] 02/18/2011

## 2011-02-18 NOTE — Progress Notes (Signed)
Physical Therapy Treatment Patient Details Name: Beth Burke MRN: [id number] DOB: April 06, 1940 Today's Date: 02/18/2011  [phone] G, TE  PT Assessment/Plan  PT - Assessment/Plan Comments on Treatment Session: Pt presents weaker during am session due to HGB level at 8.0, therefore pt unable to ambulate more than 10 ft.  Tolerates exercises well with no increase in pain.   PT Plan: Discharge plan remains appropriate PT Frequency: 7X/week Follow Up Recommendations: Home health PT Equipment Recommended: Rolling walker with 5" wheels PT Goals  Acute Rehab PT Goals PT Goal Formulation: With patient Time For Goal Achievement: 7 days Pt will go Supine/Side to Sit: with supervision PT Goal: Supine/Side to Sit - Progress: Not progressing (increased weakness during session) Pt will go Sit to Stand: with supervision PT Goal: Sit to Stand - Progress: Not progressing (due to weakness during session) Pt will go Stand to Sit: with supervision PT Goal: Stand to Sit - Progress: Not progressing (pt weaker during session) Pt will Ambulate: 51 - 150 feet;with supervision;with least restrictive assistive device PT Goal: Ambulate - Progress: Not progressing (pt weaker during session) Pt will Perform Home Exercise Program: with supervision, verbal cues required/provided PT Goal: Perform Home Exercise Program - Progress: Progressing toward goal  PT Treatment Precautions/Restrictions  Precautions Precautions: Knee Required Braces or Orthoses: Yes Knee Immobilizer: Discontinue once straight leg raise with < 10 degree lag Restrictions Weight Bearing Restrictions: No RLE Weight Bearing: Weight bearing as tolerated Mobility (including Balance) Bed Mobility Bed Mobility: Yes Supine to Sit: 3: Mod assist;HOB elevated (Comment degrees) (HOB elevated approx 30 deg) Supine to Sit Details (indicate cue type and reason): Requires increased assist today due to pt stating she feels "weak."  Requires assist with RLE  and trunk to get EOB with cues for hand placement to assist trunk.   Transfers Transfers: Yes Sit to Stand: 3: Mod assist;From bed;With upper extremity assist Sit to Stand Details (indicate cue type and reason): Requires assist with forward weight shift with cues for hand placement and RLE management.  Stand to Sit: 4: Min assist;3: Mod assist;With upper extremity assist;With armrests;To chair/3-in-1 Stand to Sit Details: Requires assist for controlled descent with cues for hand placement and RLE management.  Ambulation/Gait Ambulation/Gait: Yes Ambulation/Gait Assistance: 1: +2 Total assist (Pt assist 70%) Ambulation/Gait Assistance Details (indicate cue type and reason): +2 for safety due to pt stated weakness with cues for sequencing/technique with RW and upright posture Ambulation Distance (Feet): 10 Feet Assistive device: Rolling walker Gait Pattern: Step-to pattern;Decreased stance time - right;Decreased step length - left Gait velocity: decreased Stairs: No Wheelchair Mobility Wheelchair Mobility: No    Exercise  Total Joint Exercises Ankle Circles/Pumps: AROM;Both;20 reps;Seated Quad Sets: AROM;Right;10 reps;Seated Short Arc Quad: AAROM;Right;10 reps;Seated Heel Slides: AAROM;Right;10 reps;Seated Hip ABduction/ADduction: AAROM;Right;10 reps;Seated Straight Leg Raises: AAROM;Right;10 reps;Seated End of Session PT - End of Session Equipment Utilized During Treatment: Gait belt;Right knee immobilizer Activity Tolerance: Patient limited by fatigue;Other (comment) (Limited by weakness. ) Patient left: in chair;with call bell in reach;with family/visitor present Nurse Communication: Other (comment) (RN communicated HGB level) General Behavior During Session: [facility] for tasks performed Cognition: [facility] for tasks performed  Page, Meribeth Mattes 02/18/2011, 9:16 AM

## 2011-02-19 LAB — CBC
MCHC: 34.8 g/dL (ref 30.0–36.0)
Platelets: 93 10*3/uL — ABNORMAL LOW (ref 150–400)
RDW: 14.2 % (ref 11.5–15.5)
WBC: 9.8 10*3/uL (ref 4.0–10.5)

## 2011-02-19 MED ORDER — RIVAROXABAN 10 MG PO TABS: 10.0000 mg | ORAL_TABLET | Freq: Every day | ORAL | Status: DC

## 2011-02-19 MED ORDER — POLYSACCHARIDE IRON 150 MG PO CAPS: 150.0000 mg | ORAL_CAPSULE | Freq: Every day | ORAL | Status: DC

## 2011-02-19 MED ORDER — METHOCARBAMOL 500 MG PO TABS: 500.0000 mg | ORAL_TABLET | Freq: Four times a day (QID) | ORAL | Status: AC | PRN

## 2011-02-19 MED ORDER — OXYCODONE HCL 5 MG PO TABS: 5.0000 mg | ORAL_TABLET | ORAL | Status: AC | PRN

## 2011-02-19 NOTE — Progress Notes (Signed)
Physical Therapy Treatment Patient Details Name: Beth Burke MRN: [id number] DOB: [birth year] Today's Date: 02/19/2011  [phone] 2G  PT Assessment/Plan  PT - Assessment/Plan Comments on Treatment Session: Pt tolerated increased ambulation and stair training well during am session.  Pt moves well however at slow speeds.  Pt states she will be D/C today.  Will see for pm session to go through exercises.   PT Plan: Discharge plan remains appropriate PT Frequency: 7X/week Follow Up Recommendations: Home health PT Equipment Recommended: None recommended by OT PT Goals  Acute Rehab PT Goals PT Goal Formulation: With patient Time For Goal Achievement: 7 days Pt will go Sit to Supine/Side: with supervision PT Goal: Sit to Supine/Side - Progress: Progressing toward goal Pt will go Sit to Stand: with supervision PT Goal: Sit to Stand - Progress: Met Pt will go Stand to Sit: with supervision PT Goal: Stand to Sit - Progress: Met Pt will Ambulate: 51 - 150 feet;with supervision;with least restrictive assistive device PT Goal: Ambulate - Progress: Progressing toward goal Pt will Go Up / Down Stairs: 1-2 stairs;with min assist;with least restrictive assistive device PT Goal: Up/Down Stairs - Progress: Met  PT Treatment Precautions/Restrictions  Precautions Precautions: Knee Required Braces or Orthoses: Yes Knee Immobilizer: Discontinue once straight leg raise with < 10 degree lag Restrictions Weight Bearing Restrictions: No RLE Weight Bearing: Weight bearing as tolerated Mobility (including Balance) Bed Mobility Bed Mobility: Yes Supine to Sit: 4: Min assist;With rails Supine to Sit Details (indicate cue type and reason): increased time, pt reports "feeling nervous" moves slowly. Min A RLE Sit to Supine: 4: Min assist Sit to Supine - Details (indicate cue type and reason): Requires some assist for RLE Transfers Transfers: Yes Sit to Stand: 5: Supervision;With armrests;With upper  extremity assist;From chair/3-in-1 Sit to Stand Details (indicate cue type and reason): Supervision for safety with cues for hand placement Stand to Sit: 5: Supervision;With upper extremity assist;To bed Stand to Sit Details: Supervision for safety with cues for hand placement and RLE managment.  Ambulation/Gait Ambulation/Gait: Yes Ambulation/Gait Assistance: 4: Min assist Ambulation/Gait Assistance Details (indicate cue type and reason): Min/guard for safety with cues for RW placement and increase step length BLE.   Ambulation Distance (Feet): 150 Feet Assistive device: Rolling walker Gait Pattern: Step-to pattern Gait velocity: decreased Stairs: Yes Stairs Assistance: 4: Min assist Stairs Assistance Details (indicate cue type and reason): Requires cues for sequencing/technique Stair Management Technique: Two rails;Step to pattern;Forwards Number of Stairs: 4  Height of Stairs: 4     Exercise    End of Session PT - End of Session Equipment Utilized During Treatment: Gait belt;Right knee immobilizer Activity Tolerance: Patient limited by fatigue;Other (comment) Patient left: in bed;with call bell in reach;with family/visitor present General Behavior During Session: [facility] for tasks performed Cognition: [facility] for tasks performed  Beth Burke, Beth Burke 02/19/2011, 10:41 AM

## 2011-02-19 NOTE — Progress Notes (Signed)
Patient given discharge instructions and prescriptions. Verbalized understanding of instructions. Tolerating diet, pain controlled. Iv site removed, catheter tip intact. Left unit in wheelchair accompanied by staff and family member

## 2011-02-19 NOTE — Progress Notes (Signed)
Subjective: 3 Days Post-Op Procedure(s) (LRB): TOTAL KNEE ARTHROPLASTY (Right) Patient reports pain as mild.   Patient seen in rounds by Dr. Lequita Halt. Patient doing OK this morning.  Plan is to go home but will need to work with therapy first. If she does well, then plan home later today.  Objective: Vital signs in last 24 hours: Temp:  [98.6 F (37 C)-99.9 F (37.7 C)] 98.6 F (37 C) (01/24 0457) Pulse Rate:  [70-83] 83  (01/24 0457) Resp:  [14-24] 16  (01/24 0457) BP: (107-135)/(62-77) 121/69 mmHg (01/24 0457) SpO2:  [89 %-99 %] 99 % (01/24 0457)  Intake/Output from previous day:  Intake/Output Summary (Last 24 hours) at 02/19/11 1057 Last data filed at 02/19/11 0900  Gross per 24 hour  Intake   2125 ml  Output   2850 ml  Net   -725 ml    Intake/Output this shift: Total I/O In: 240 [P.O.:240] Out: -   Labs: Results for orders placed during the hospital encounter of 02/16/11  TYPE AND SCREEN      Component Value Range   ABO/RH(D) A POS     Antibody Screen NEG     Sample Expiration 02/19/2011     Unit Number [id number]     Blood Component Type RED CELLS,LR     Unit division 00     Status of Unit ISSUED,FINAL     Transfusion Status OK TO TRANSFUSE     Crossmatch Result Compatible     Unit Number [id number]     Blood Component Type RED CELLS,LR     Unit division 00     Status of Unit ISSUED,FINAL     Transfusion Status OK TO TRANSFUSE     Crossmatch Result Compatible    ABO/RH      Component Value Range   ABO/RH(D) A POS    CBC      Component Value Range   WBC 8.1  4.0 - 10.5 (K/uL)   RBC 3.13 (*) 3.87 - 5.11 (MIL/uL)   Hemoglobin 9.3 (*) 12.0 - 15.0 (g/dL)   HCT 78.2 (*) 95.6 - 46.0 (%)   MCV 89.1  78.0 - 100.0 (fL)   MCH 29.7  26.0 - 34.0 (pg)   MCHC 33.3  30.0 - 36.0 (g/dL)   RDW 21.3  08.6 - 57.8 (%)   Platelets 140 (*) 150 - 400 (K/uL)  BASIC METABOLIC PANEL      Component Value Range   Sodium 136  135 - 145 (mEq/L)   Potassium 3.6  3.5 - 5.1  (mEq/L)   Chloride 102  96 - 112 (mEq/L)   CO2 25  19 - 32 (mEq/L)   Glucose, Bld 132 (*) 70 - 99 (mg/dL)   BUN 8  6 - 23 (mg/dL)   Creatinine, Ser 4.69  0.50 - 1.10 (mg/dL)   Calcium 8.3 (*) 8.4 - 10.5 (mg/dL)   GFR calc non Af Amer 90 (*) >90 (mL/min)   GFR calc Af Amer >90  >90 (mL/min)  CBC      Component Value Range   WBC 9.7  4.0 - 10.5 (K/uL)   RBC 2.66 (*) 3.87 - 5.11 (MIL/uL)   Hemoglobin 8.0 (*) 12.0 - 15.0 (g/dL)   HCT 62.9 (*) 52.8 - 46.0 (%)   MCV 89.5  78.0 - 100.0 (fL)   MCH 30.1  26.0 - 34.0 (pg)   MCHC 33.6  30.0 - 36.0 (g/dL)   RDW 41.3  24.4 - 01.0 (%)  Platelets 107 (*) 150 - 400 (K/uL)  BASIC METABOLIC PANEL      Component Value Range   Sodium 135  135 - 145 (mEq/L)   Potassium 3.4 (*) 3.5 - 5.1 (mEq/L)   Chloride 102  96 - 112 (mEq/L)   CO2 27  19 - 32 (mEq/L)   Glucose, Bld 104 (*) 70 - 99 (mg/dL)   BUN 7  6 - 23 (mg/dL)   Creatinine, Ser 1.61  0.50 - 1.10 (mg/dL)   Calcium 8.1 (*) 8.4 - 10.5 (mg/dL)   GFR calc non Af Amer 88 (*) >90 (mL/min)   GFR calc Af Amer >90  >90 (mL/min)  PREPARE RBC (CROSSMATCH)      Component Value Range   Order Confirmation ORDER PROCESSED BY BLOOD BANK    CBC      Component Value Range   WBC 9.8  4.0 - 10.5 (K/uL)   RBC 3.42 (*) 3.87 - 5.11 (MIL/uL)   Hemoglobin 10.4 (*) 12.0 - 15.0 (g/dL)   HCT 09.6 (*) 04.5 - 46.0 (%)   MCV 87.4  78.0 - 100.0 (fL)   MCH 30.4  26.0 - 34.0 (pg)   MCHC 34.8  30.0 - 36.0 (g/dL)   RDW 40.9  81.1 - 91.4 (%)   Platelets 93 (*) 150 - 400 (K/uL)    Exam: Neurovascular intact Sensation intact distally Incision - clean, dry, no drainage Motor function intact - moving foot and toes well on exam.   Assessment/Plan: 3 Days Post-Op Procedure(s) (LRB): TOTAL KNEE ARTHROPLASTY (Right) Procedure(s) (LRB): TOTAL KNEE ARTHROPLASTY (Right) Past Medical History  Diagnosis Date  . Hypertension   . Heart murmur   . Varicose veins   . Elevated cholesterol   . GERD (gastroesophageal reflux  disease)   . Vertigo   . Anxiety   . 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 Scarlet fever as child  . Arthritis   . Urinary incontinence     wears pads   Principal Problem:  *OA (osteoarthritis) of knee Active Problems:  Postop Acute blood loss anemia  Postop Hypokalemia  Postop Transfusion history   Discharge home with home health Diet - heart healthy Follow up - in 2 weeks Activity - WBAT Condition Upon Discharge - Stable D/C Meds - Oxy, Robaxin DVT Prophylaxis - Xarelto Protocol   PERKINS, ALEXZANDREW 02/19/2011, 10:57 AM

## 2011-02-19 NOTE — Progress Notes (Signed)
  CARE MANAGEMENT NOTE 02/19/2011  Patient:  Beth Burke, Beth Burke   Account Number:  [id number]  Date Initiated:  02/18/2011  Documentation initiated by:  Colleen Can  Subjective/Objective Assessment:   dx osteoarthritis right knee; total knee replacemnt     Action/Plan:   CM spoke with patient and daughter. Plans are for patient to return to her home where her daughter will be caregiver. Wants [facility] for [facility]. already has dme   Anticipated DC Date:  02/18/2010   Anticipated DC Plan:  HOME W HOME HEALTH SERVICES  In-house referral  NA      DC Planning Services  CM consult      [facility] Choice  HOME HEALTH   Choice offered to / List presented to:  C-1 Patient   DME arranged  NA      DME agency  NA     HH arranged  HH-2 PT      [facility] agency  [facility]   Status of service:  Completed, signed off Medicare Important Message given?  NA - LOS <3 / Initial given by admissions (If response is "NO", the following Medicare IM given date fields will be blank)    Discharge Disposition:  HOME W HOME HEALTH SERVICES     Comments:  List of [facility] agencies placed in shadow chart

## 2011-02-19 NOTE — Progress Notes (Signed)
Physical Therapy Treatment Patient Details Name: Beth Burke MRN: [id number] DOB: 1940-12-28 Today's Date: 02/19/2011  [phone] TE,G  PT Assessment/Plan  PT - Assessment/Plan Comments on Treatment Session: Pt tolerated ambulation and exercises well.  Ready to D/C with HHPT for follow up therapy.  PT Plan: Discharge plan remains appropriate PT Frequency: 7X/week Follow Up Recommendations: Home health PT Equipment Recommended: None recommended by OT PT Goals  Acute Rehab PT Goals PT Goal Formulation: With patient Time For Goal Achievement: 7 days Pt will go Sit to Supine/Side: with supervision PT Goal: Sit to Supine/Side - Progress: Progressing toward goal Pt will go Sit to Stand: with supervision PT Goal: Sit to Stand - Progress: Met Pt will go Stand to Sit: with supervision PT Goal: Stand to Sit - Progress: Met Pt will Ambulate: 51 - 150 feet;with supervision;with least restrictive assistive device PT Goal: Ambulate - Progress: Met Pt will Go Up / Down Stairs: 1-2 stairs;with min assist;with least restrictive assistive device PT Goal: Up/Down Stairs - Progress: Met Pt will Perform Home Exercise Program: with supervision, verbal cues required/provided PT Goal: Perform Home Exercise Program - Progress: Met  PT Treatment Precautions/Restrictions  Precautions Precautions: Knee Required Braces or Orthoses: Yes Knee Immobilizer: Discontinue once straight leg raise with < 10 degree lag Restrictions Weight Bearing Restrictions: No RLE Weight Bearing: Weight bearing as tolerated Mobility (including Balance) Bed Mobility Bed Mobility: Yes Supine to Sit: 4: Min assist;With rails Supine to Sit Details (indicate cue type and reason): increased time, pt reports "feeling nervous" moves slowly. Min A RLE Sit to Supine: 4: Min assist Sit to Supine - Details (indicate cue type and reason): Requires some assist for RLE Transfers Transfers: Yes Sit to Stand: 5: Supervision;With  armrests;With upper extremity assist;From chair/3-in-1 Sit to Stand Details (indicate cue type and reason): supervision for safety with cues for hand placement Stand to Sit: 5: Supervision;With upper extremity assist;To bed Stand to Sit Details: supervision for safety  Ambulation/Gait Ambulation/Gait: Yes Ambulation/Gait Assistance: 5: Supervision Ambulation/Gait Assistance Details (indicate cue type and reason): Requires minor cuing for RW placement Ambulation Distance (Feet): 150 Feet Assistive device: Rolling walker Gait Pattern: Step-to pattern Gait velocity: decreased Stairs: Yes Stairs Assistance: 4: Min assist Stairs Assistance Details (indicate cue type and reason): Requires cues for sequencing/technique Stair Management Technique: Two rails;Step to pattern;Forwards Number of Stairs: 4  Height of Stairs: 4     Exercise  Total Joint Exercises Ankle Circles/Pumps: AROM;Both;20 reps;Seated Quad Sets: AROM;Right;10 reps;Seated Short Arc Quad: AAROM;Right;10 reps;Seated Heel Slides: AAROM;Right;10 reps;Seated Hip ABduction/ADduction: AAROM;Right;10 reps;Seated Straight Leg Raises: AAROM;Right;10 reps;Seated End of Session PT - End of Session Equipment Utilized During Treatment: Right knee immobilizer Activity Tolerance: Patient tolerated treatment well Patient left: in chair;with call bell in reach;with family/visitor present General Behavior During Session: [facility] for tasks performed Cognition: [facility] for tasks performed  Page, Meribeth Mattes 02/19/2011, 1:40 PM

## 2011-02-26 NOTE — Discharge Summary (Signed)
Physician Discharge Summary   Patient ID: Beth Burke MRN: [id number] DOB/AGE: 09-29-1940 71 y.o.  Admit date: 02/16/2011 Discharge date: 02/19/2011  Primary Diagnosis: Osteoarthritis Right Knee  Admission Diagnoses: Past Medical History  Diagnosis Date  . Hypertension   . Heart murmur   . Varicose veins   . Elevated cholesterol   . GERD (gastroesophageal reflux disease)   . Vertigo   . Anxiety   . 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 Scarlet fever as child  . Arthritis   . Urinary incontinence     wears pads   Discharge Diagnoses:  Principal Problem:  *OA (osteoarthritis) of knee Active Problems:  Postop Acute blood loss anemia  Postop Hypokalemia  Postop Transfusion history   Procedure: Procedure(s) (LRB): TOTAL KNEE ARTHROPLASTY (Right)   Consults: None  HPI: Beth Burke is a 71 y.o. year old female with end stage OA of her right knee with progressively worsening pain and dysfunction. She has constant pain, with activity and at rest and significant functional deficits with difficulties even with ADLs. She has had extensive non-op management including analgesics, injections of cortisone and viscosupplements, and home exercise program, but remains in significant pain with significant dysfunction.Radiographs show bone on bone arthritis medial and patellofemoral with varus deformity and tibial subluxation. She presents now for left Total Knee Arthroplasty.   Laboratory Data: Hospital Outpatient Visit on 02/09/2011  Component Date Value Range Status  . aPTT (seconds) 02/09/2011 46* 24-37 Final   Comment:                                 IF BASELINE aPTT IS ELEVATED,                          SUGGEST PATIENT RISK ASSESSMENT                          BE USED TO DETERMINE APPROPRIATE                          ANTICOAGULANT THERAPY.  . WBC (K/uL) 02/09/2011 7.1  4.0-10.5 Final  . RBC (MIL/uL) 02/09/2011 4.08  3.87-5.11 Final  . Hemoglobin (g/dL) 95/62/1308  65.7  84.6-96.2 Final  . HCT (%) 02/09/2011 36.9  36.0-46.0 Final  . MCV (fL) 02/09/2011 90.4  78.0-100.0 Final  . MCH (pg) 02/09/2011 29.4  26.0-34.0 Final  . MCHC (g/dL) 95/28/4132 44.0  10.2-72.5 Final  . RDW (%) 02/09/2011 13.6  11.5-15.5 Final  . Platelets (K/uL) 02/09/2011 170  150-400 Final  . Sodium (mEq/L) 02/09/2011 141  135-145 Final  . Potassium (mEq/L) 02/09/2011 4.6  3.5-5.1 Final  . Chloride (mEq/L) 02/09/2011 107  96-112 Final  . CO2 (mEq/L) 02/09/2011 25  19-32 Final  . Glucose, Bld (mg/dL) 36/64/4034 94  74-25 Final  . BUN (mg/dL) 95/63/8756 17  4-33 Final  . Creatinine, Ser (mg/dL) 29/51/8841 6.60  6.30-1.60 Final  . Calcium (mg/dL) 10/93/2355 9.3  7.3-22.0 Final  . Total Protein (g/dL) 25/42/7062 7.3  3.7-6.2 Final  . Albumin (g/dL) 83/15/1761 3.8  6.0-7.3 Final  . AST (U/L) 02/09/2011 17  0-37 Final  . ALT (U/L) 02/09/2011 14  0-35 Final  . Alkaline Phosphatase (U/L) 02/09/2011 45  39-117 Final  . Total Bilirubin (mg/dL) 71/06/2692 0.2* 8.5-4.6 Final  .  GFR calc non Af Amer (mL/min) 02/09/2011 71* >90 Final  . GFR calc Af Amer (mL/min) 02/09/2011 82* >90 Final   Comment:                                 The eGFR has been calculated                          using the CKD EPI equation.                          This calculation has not been                          validated in all clinical                          situations.                          eGFR's persistently                          <90 mL/min signify                          possible Chronic Kidney Disease.  Marland Kitchen Prothrombin Time (seconds) 02/09/2011 12.9  11.6-15.2 Final  . INR  02/09/2011 0.95  0.00-1.49 Final  . Color, Urine  02/09/2011 YELLOW  YELLOW Final  . APPearance  02/09/2011 CLEAR  CLEAR Final  . Specific Gravity, Urine  02/09/2011 1.008  1.005-1.030 Final  . pH  02/09/2011 6.5  5.0-8.0 Final  . Glucose, UA (mg/dL) 57/84/6962 NEGATIVE  NEGATIVE Final  . Hgb urine dipstick  02/09/2011  TRACE* NEGATIVE Final  . Bilirubin Urine  02/09/2011 NEGATIVE  NEGATIVE Final  . Ketones, ur (mg/dL) 95/28/4132 NEGATIVE  NEGATIVE Final  . Protein, ur (mg/dL) 44/01/270 NEGATIVE  NEGATIVE Final  . Urobilinogen, UA (mg/dL) 53/66/4403 0.2  4.7-4.2 Final  . Nitrite  02/09/2011 NEGATIVE  NEGATIVE Final  . Leukocytes, UA  02/09/2011 NEGATIVE  NEGATIVE Final  . MRSA, PCR  02/09/2011 NEGATIVE  NEGATIVE Final  . Staphylococcus aureus  02/09/2011 POSITIVE* NEGATIVE Final   Comment:                                 The Xpert SA Assay (FDA                          approved for NASAL specimens                          only), is one component of                          a comprehensive surveillance                          program.  It is not intended  to diagnose infection nor to                          guide or monitor treatment.  . RBC / HPF (RBC/hpf) 02/09/2011 0-2  <3 Final   No results found for this basename: HGB:5 in the last 72 hours No results found for this basename: WBC:2,RBC:2,HCT:2,PLT:2 in the last 72 hours No results found for this basename: NA:2,K:2,CL:2,CO2:2,BUN:2,CREATININE:2,GLUCOSE:2,CALCIUM:2 in the last 72 hours No results found for this basename: LABPT:2,INR:2 in the last 72 hours  X-Rays:No results found.  EKG:No orders found for this or any previous visit.   Hospital Course: Patient was admitted to [facility] and taken to the OR and underwent the above state procedure without complications.  Patient tolerated the procedure well and was later transferred to the recovery room and then to the orthopaedic floor for postoperative care.  They were given PO and IV analgesics for pain control following their surgery.  They were given 24 hours of postoperative antibiotics and started on DVT prophylaxis.   PT and OT were ordered for total joint protocol.  Discharge planning consulted to help with postop disposition and equipment needs.  Patient had  a decent night on the evening of surgery and started to get up with therapy on day one.  PCA was discontinued and they were weaned over to PO meds.  Hemovac drain was pulled without difficulty.  Continued to progress with therapy into day two.  Dressing was changed on day two and the incision was healing well but the HGB had dropped down to 8.0.  Recommended blood and the patient received two units.  By day three, the patient had progressed with therapy and meeting goals and feeling better after the blood.  Incision was healing well.  Patient was seen in rounds and was ready to go home.  Discharge Medications: Prior to Admission medications   Medication Sig Start Date End Date Taking? Authorizing Provider  amLODipine (NORVASC) 5 MG tablet Take 5 mg by mouth daily after breakfast.     Historical Provider, MD  benazepril (LOTENSIN) 40 MG tablet Take 40 mg by mouth at bedtime.     Historical Provider, MD  fexofenadine (ALLEGRA) 180 MG tablet Take 180 mg by mouth daily as needed. Allergies     Historical Provider, MD  fluticasone (FLONASE) 50 MCG/ACT nasal spray Place 2 sprays into the nose daily as needed. Allergies    Historical Provider, MD  hydroxypropyl methylcellulose (ISOPTO TEARS) 2.5 % ophthalmic solution Place 1 drop into both eyes every evening.     Historical Provider, MD  methocarbamol (ROBAXIN) 500 MG tablet Take 1 tablet (500 mg total) by mouth every 6 (six) hours as needed. 02/19/11 03/01/11  Katya Rolston, PA  omeprazole (PRILOSEC) 20 MG capsule Take 20 mg by mouth daily as needed. Heart burn     Historical Provider, MD  oxyCODONE (OXY IR/ROXICODONE) 5 MG immediate release tablet Take 1-2 tablets (5-10 mg total) by mouth every 4 (four) hours as needed for pain. 02/19/11 03/01/11  Tawney Vanorman, PA  polysaccharide iron (NIFEREX) 150 MG CAPS capsule Take 1 capsule (150 mg total) by mouth daily. 02/19/11   Henleigh Robello Julien Girt, PA  rivaroxaban (XARELTO) 10 MG TABS tablet Take 1 tablet  (10 mg total) by mouth daily with breakfast. 02/19/11   Babita Amaker Julien Girt, PA  sertraline (ZOLOFT) 100 MG tablet Take 100 mg by mouth daily after breakfast.     Historical Provider, MD  simvastatin (ZOCOR) 10 MG tablet Take 10 mg by mouth at bedtime.     Historical Provider, MD    Diet: heart healthy  Activity:WBAT  Follow-up:in 2 weeks  Disposition: Home Discharged Condition: good   Discharge Orders    Future Orders Please Complete By Expires   Diet - low sodium heart healthy      Call MD / Call 911      Comments:   If you experience chest pain or shortness of breath, CALL 911 and be transported to the hospital emergency room.  If you develope a fever above 101 F, pus (white drainage) or increased drainage or redness at the wound, or calf pain, call your surgeon's office.   Constipation Prevention      Comments:   Drink plenty of fluids.  Prune juice may be helpful.  You may use a stool softener, such as Colace (over the counter) 100 mg twice a day.  Use MiraLax (over the counter) for constipation as needed.   Increase activity slowly as tolerated      Weight Bearing as taught in Physical Therapy      Comments:   Use a walker or crutches as instructed.   Discharge instructions      Comments:   Pick up stool softner and laxative for home. Do not submerge incision under water. May shower. Continue to use ice for pain and swelling from surgery.    Driving restrictions      Comments:   No driving   Lifting restrictions      Comments:   No lifting   TED hose      Comments:   Use stockings (TED hose) for 3 weeks on both leg(s).  You may remove them at night for sleeping.   Change dressing      Comments:   Change dressing daily with sterile 4 x 4 inch gauze dressing and apply TED hose.  You may clean the incision with alcohol prior to redressing.   Do not put a pillow under the knee. Place it under the heel.        Medication List  As of 02/26/2011 11:20 AM   STOP taking  these medications         aspirin EC 81 MG tablet      CALTRATE 600+D PLUS PO      CENTRUM SILVER PO      fish oil-omega-3 fatty acids 1000 MG capsule      Vitamin D 2000 UNITS tablet         TAKE these medications         amLODipine 5 MG tablet   Commonly known as: NORVASC   Take 5 mg by mouth daily after breakfast.      benazepril 40 MG tablet   Commonly known as: LOTENSIN   Take 40 mg by mouth at bedtime.      fexofenadine 180 MG tablet   Commonly known as: ALLEGRA   Take 180 mg by mouth daily as needed. Allergies        fluticasone 50 MCG/ACT nasal spray   Commonly known as: FLONASE   Place 2 sprays into the nose daily as needed. Allergies      hydroxypropyl methylcellulose 2.5 % ophthalmic solution   Commonly known as: ISOPTO TEARS   Place 1 drop into both eyes every evening.      methocarbamol 500 MG tablet   Commonly known as: ROBAXIN   Take 1 tablet (500 mg  total) by mouth every 6 (six) hours as needed.      omeprazole 20 MG capsule   Commonly known as: PRILOSEC   Take 20 mg by mouth daily as needed. Heart burn        oxyCODONE 5 MG immediate release tablet   Commonly known as: Oxy IR/ROXICODONE   Take 1-2 tablets (5-10 mg total) by mouth every 4 (four) hours as needed for pain.      polysaccharide iron 150 MG Caps capsule   Commonly known as: NIFEREX   Take 1 capsule (150 mg total) by mouth daily.      rivaroxaban 10 MG Tabs tablet   Commonly known as: XARELTO   Take 1 tablet (10 mg total) by mouth daily with breakfast.      sertraline 100 MG tablet   Commonly known as: ZOLOFT   Take 100 mg by mouth daily after breakfast.      simvastatin 10 MG tablet   Commonly known as: ZOCOR   Take 10 mg by mouth at bedtime.           Follow-up Information    Follow up with Loanne Drilling, MD. Schedule an appointment as soon as possible for a visit in 2 weeks.   Contact information:   [facility] [address], [address] [place] [address] [phone]          Signed: Patrica Duel 02/26/2011, 11:20 AM

## 2011-05-19 ENCOUNTER — Other Ambulatory Visit: Payer: Self-pay | Admitting: Orthopedic Surgery

## 2011-05-19 ENCOUNTER — Encounter (HOSPITAL_COMMUNITY): Payer: Self-pay | Admitting: Pharmacy Technician

## 2011-05-19 MED ORDER — DEXAMETHASONE SODIUM PHOSPHATE 10 MG/ML IJ SOLN: 10.0000 mg | Freq: Once | INTRAMUSCULAR | Status: DC

## 2011-05-20 ENCOUNTER — Encounter (HOSPITAL_COMMUNITY): Payer: Self-pay

## 2011-05-20 ENCOUNTER — Ambulatory Visit (HOSPITAL_COMMUNITY)
Admission: RE | Admit: 2011-05-20 | Discharge: 2011-05-20 | Disposition: A | Discharge status: Home / Self Care | Payer: Medicare Other | Source: Ambulatory Visit | Attending: Orthopedic Surgery | Admitting: Orthopedic Surgery

## 2011-05-20 ENCOUNTER — Encounter (HOSPITAL_COMMUNITY)
Admission: RE | Admit: 2011-05-20 | Discharge: 2011-05-20 | Disposition: A | Discharge status: Home / Self Care | Payer: Medicare Other | Source: Ambulatory Visit | Attending: Orthopedic Surgery | Admitting: Orthopedic Surgery

## 2011-05-20 DIAGNOSIS — Z01812 Encounter for preprocedural laboratory examination: Secondary | ICD-10-CM | POA: Insufficient documentation

## 2011-05-20 DIAGNOSIS — Z01818 Encounter for other preprocedural examination: Secondary | ICD-10-CM | POA: Insufficient documentation

## 2011-05-20 HISTORY — DX: Encounter for other specified aftercare: Z51.89

## 2011-05-20 HISTORY — DX: Reserved for inherently not codable concepts without codable children: IMO0001

## 2011-05-20 LAB — BASIC METABOLIC PANEL
GFR calc Af Amer: >90 mL/min (ref >90)
GFR calc non Af Amer: >90 mL/min (ref >90)
Glucose, Bld: 72 mg/dL (ref 70–99)
Potassium: 4.1 mEq/L (ref 3.5–5.1)
Sodium: 140 mEq/L (ref 135–145)

## 2011-05-20 LAB — CBC
Hemoglobin: 12.2 g/dL (ref 12.0–15.0)
MCHC: 33.1 g/dL (ref 30.0–36.0)
RDW: 14.1 % (ref 11.5–15.5)

## 2011-05-20 NOTE — Patient Instructions (Signed)
YOUR SURGERY IS SCHEDULED ON:  Friday  4/26  AT 4:45 PM  REPORT TO [place] [facility]AY [facility] AT:  2:15 PM      PHONE # FOR SHORT STAY IS [phone]  DO NOT EAT  ANYTHING AFTER MIDNIGHT THE NIGHT BEFORE YOUR SURGERY.  NO FOOD, NO CHEWING GUM, NO MINTS, NO CANDIES, NO CHEWING TOBACCO. YOU MAY HAVE CLEAR LIQUIDS TO DRINK FROM MIDNIGHT UNTIL 10:00 AM -LIKE WATER OR SODA AND COFFEE--NO MILK OR MILK PRODUCTS  PLEASE TAKE THE FOLLOWING MEDICATIONS THE AM OF YOUR SURGERY WITH A FEW SIPS OF WATER:  AMLODIPINE, ALLEGRA, LORAZEPAM,  FLONASE    IF YOU USE INHALERS--USE YOUR INHALERS THE AM OF YOUR SURGERY AND BRING INHALERS TO THE HOSPITAL -TAKE TO SURGERY.    IF YOU ARE DIABETIC:  DO NOT TAKE ANY DIABETIC MEDICATIONS THE AM OF YOUR SURGERY.  IF YOU TAKE INSULIN IN THE EVENINGS--PLEASE ONLY TAKE 1/2 NORMAL EVENING DOSE THE NIGHT BEFORE YOUR SURGERY.  NO INSULIN THE AM OF YOUR SURGERY.  IF YOU HAVE SLEEP APNEA AND USE CPAP OR BIPAP--PLEASE BRING THE MASK --NOT THE MACHINE-NOT THE TUBING   -JUST THE MASK. DO NOT BRING VALUABLES, MONEY, CREDIT CARDS.  CONTACT LENS, DENTURES / PARTIALS, GLASSES SHOULD NOT BE WORN TO SURGERY AND IN MOST CASES-HEARING AIDS WILL NEED TO BE REMOVED.  BRING YOUR GLASSES CASE, ANY EQUIPMENT NEEDED FOR YOUR CONTACT LENS. FOR PATIENTS ADMITTED TO THE HOSPITAL--CHECK OUT TIME THE DAY OF DISCHARGE IS 11:00 AM.  ALL INPATIENT ROOMS ARE PRIVATE - WITH BATHROOM, TELEPHONE, TELEVISION AND WIFI INTERNET. IF YOU ARE BEING DISCHARGED THE SAME DAY OF YOUR SURGERY--YOU CAN NOT DRIVE YOURSELF HOME--AND SHOULD NOT GO HOME ALONE BY TAXI OR BUS.  NO DRIVING OR OPERATING MACHINERY FOR 24 HOURS FOLLOWING ANESTHESIA / PAIN MEDICATIONS.                            SPECIAL INSTRUCTIONS:  CHLORHEXIDINE SOAP SHOWER (other brand names are Betasept and Hibiclens ) PLEASE SHOWER WITH CHLORHEXIDINE THE NIGHT BEFORE YOUR SURGERY AND THE AM OF YOUR SURGERY. DO NOT USE CHLORHEXIDINE ON YOUR FACE OR PRIVATE  AREAS--YOU MAY USE YOUR NORMAL SOAP THOSE AREAS AND YOUR NORMAL SHAMPOO.  WOMEN SHOULD AVOID SHAVING UNDER ARMS AND SHAVING LEGS 48 HOURS BEFORE USING CHLORHEXIDINE TO AVOID SKIN IRRITATION.  DO NOT USE IF ALLERGIC TO CHLORHEXIDINE.  PLEASE READ OVER ANY  FACT SHEETS THAT YOU WERE GIVEN: MRSA INFORMATION, INCENTIVE SPIROMETER INFORMATION.

## 2011-05-20 NOTE — Pre-Procedure Instructions (Signed)
PT'S EKG REPORT 02/03/2011 FROM DR. PERINI ON PT'S CHART AND SCANNED INTO EPIC.  CXR, CBC, BMET WERE DONE TODAY - PREOP AT [facility].

## 2011-05-21 NOTE — H&P (Signed)
CC- Beth Burke is a 71 y.o. female who presents with right stiffness.  HPI- . Knee Pain: Patient presents with stiffness involving the  right knee. Onset of the symptoms was about a month ago. Inciting event: TKA. Current symptoms include stiffness. Pain is aggravated by PT.  Patient has had prior knee problems. Evaluation to date: PT. Treatment to date: PT which was somewhat effective.  Past Medical History  Diagnosis Date  . Hypertension   . Heart murmur   . Varicose veins   . Elevated cholesterol   . GERD (gastroesophageal reflux disease)   . Vertigo   . Anxiety   . 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 Scarlet fever as child  . Arthritis   . Urinary incontinence     wears pads  . Blood transfusion     WITH KNEE REPLACEMENT 01/2011    Past Surgical History  Procedure Date  . Cervical fusion april 2012    C5 to C7 with plates--PT HAS LIMITED ROM AND PAIN  . Right lower leg surgery 1962    wire placed for crushed leg   . Abdominal hysterectomy 1984  . Total knee arthroplasty 02/16/2011    Procedure: TOTAL KNEE ARTHROPLASTY;  Surgeon: Loanne Drilling, MD;  Location: [facility] ORS;  Service: Orthopedics;  Laterality: Right;    Prior to Admission medications   Medication Sig Start Date End Date Taking? Authorizing Provider  amLODipine (NORVASC) 5 MG tablet Take 5 mg by mouth daily after breakfast.     Historical Provider, MD  aspirin EC 81 MG tablet Take 81 mg by mouth at bedtime.    Historical Provider, MD  benazepril (LOTENSIN) 20 MG tablet Take 20 mg by mouth at bedtime.    Historical Provider, MD  Calcium Carbonate-Vit D-Min (CALTRATE 600+D PLUS PO) Take 1 tablet by mouth daily.    Historical Provider, MD  Cholecalciferol (VITAMIN D) 2000 UNITS tablet Take 2,000 Units by mouth daily.    Historical Provider, MD  fexofenadine (ALLEGRA) 180 MG tablet Take 180 mg by mouth daily as needed. Allergies     Historical Provider, MD  fluticasone (FLONASE) 50 MCG/ACT nasal  spray Place 2 sprays into the nose daily as needed. Allergies    Historical Provider, MD  hydroxypropyl methylcellulose (ISOPTO TEARS) 2.5 % ophthalmic solution Place 1 drop into both eyes every evening.     Historical Provider, MD  ibuprofen (ADVIL,MOTRIN) 200 MG tablet Take 600 mg by mouth every 6 (six) hours as needed. Pain     Historical Provider, MD  LORazepam (ATIVAN) 0.5 MG tablet Take by mouth every 8 (eight) hours as needed. Anxiety  WOULD TAKE 1/2 TO 1 TAB IF NEEDED     Historical Provider, MD  mirtazapine (REMERON) 15 MG tablet Take 15 mg by mouth at bedtime.    Historical Provider, MD  Multiple Vitamin (MULITIVITAMIN WITH MINERALS) TABS Take 1 tablet by mouth daily.    Historical Provider, MD  omega-3 acid ethyl esters (LOVAZA) 1 G capsule Take 1 g by mouth daily.    Historical Provider, MD  omeprazole (PRILOSEC) 20 MG capsule Take 20 mg by mouth daily as needed. Heart burn WOULD TAKE IN THE AFTERNOON IF NEEDED    Historical Provider, MD  simvastatin (ZOCOR) 10 MG tablet Take 10 mg by mouth at bedtime.     Historical Provider, MD   KNEE EXAM RON 10-85. No tenderness or instability  Physical Examination: General appearance -  alert, well appearing, and in no distress Mental status - alert, oriented to person, place, and time Chest - clear to auscultation, no wheezes, rales or rhonchi, symmetric air entry Heart - normal rate, regular rhythm, normal S1, S2, no murmurs, rubs, clicks or gallops Abdomen - soft, nontender, nondistended, no masses or organomegaly Neurological - alert, oriented, normal speech, no focal findings or movement disorder noted   Asessment/Plan--- Right knee arthrofibrosis- - Plan closed manipulation right knee. Procedure risks and potential comps discussed with patient who elects to proceed. Goals are decreased pain and increased function with a high likelihood of achieving both

## 2011-05-22 ENCOUNTER — Encounter (HOSPITAL_COMMUNITY): Payer: Self-pay | Admitting: *Deleted

## 2011-05-22 ENCOUNTER — Ambulatory Visit (HOSPITAL_COMMUNITY): Payer: Medicare Other | Admitting: Anesthesiology

## 2011-05-22 ENCOUNTER — Encounter (HOSPITAL_COMMUNITY)
Admission: RE | Disposition: A | Discharge status: Home / Self Care | Payer: Self-pay | Source: Ambulatory Visit | Attending: Orthopedic Surgery

## 2011-05-22 ENCOUNTER — Encounter (HOSPITAL_COMMUNITY): Payer: Self-pay | Admitting: Anesthesiology

## 2011-05-22 ENCOUNTER — Ambulatory Visit (HOSPITAL_COMMUNITY)
Admission: RE | Admit: 2011-05-22 | Discharge: 2011-05-22 | Disposition: A | Discharge status: Home / Self Care | Payer: Medicare Other | Source: Ambulatory Visit | Attending: Orthopedic Surgery | Admitting: Orthopedic Surgery

## 2011-05-22 DIAGNOSIS — E7800 Pure hypercholesterolemia, unspecified: Secondary | ICD-10-CM | POA: Insufficient documentation

## 2011-05-22 DIAGNOSIS — Z981 Arthrodesis status: Secondary | ICD-10-CM | POA: Insufficient documentation

## 2011-05-22 DIAGNOSIS — Z90710 Acquired absence of both cervix and uterus: Secondary | ICD-10-CM | POA: Insufficient documentation

## 2011-05-22 DIAGNOSIS — K219 Gastro-esophageal reflux disease without esophagitis: Secondary | ICD-10-CM | POA: Insufficient documentation

## 2011-05-22 DIAGNOSIS — G4730 Sleep apnea, unspecified: Secondary | ICD-10-CM | POA: Insufficient documentation

## 2011-05-22 DIAGNOSIS — Z79899 Other long term (current) drug therapy: Secondary | ICD-10-CM | POA: Insufficient documentation

## 2011-05-22 DIAGNOSIS — I10 Essential (primary) hypertension: Secondary | ICD-10-CM | POA: Insufficient documentation

## 2011-05-22 DIAGNOSIS — M24669 Ankylosis, unspecified knee: Secondary | ICD-10-CM | POA: Insufficient documentation

## 2011-05-22 DIAGNOSIS — M25669 Stiffness of unspecified knee, not elsewhere classified: Secondary | ICD-10-CM | POA: Diagnosis present

## 2011-05-22 HISTORY — PX: KNEE CLOSED REDUCTION: SHX995

## 2011-05-22 SURGERY — MANIPULATION, KNEE, CLOSED
Anesthesia: General | Site: Knee | Laterality: Right | Wound class: Clean

## 2011-05-22 MED ORDER — PROPOFOL 10 MG/ML IV EMUL
INTRAVENOUS | Status: DC | PRN
  Administered 2011-05-22: 170 mg via INTRAVENOUS

## 2011-05-22 MED ORDER — FENTANYL CITRATE 0.05 MG/ML IJ SOLN
INTRAMUSCULAR | Status: DC | PRN
  Administered 2011-05-22: 50 ug via INTRAVENOUS

## 2011-05-22 MED ORDER — FENTANYL CITRATE 0.05 MG/ML IJ SOLN
INTRAMUSCULAR | Status: AC
  Filled 2011-05-22: qty 2

## 2011-05-22 MED ORDER — LIDOCAINE HCL (CARDIAC) 20 MG/ML IV SOLN
INTRAVENOUS | Status: DC | PRN
  Administered 2011-05-22: 80 mg via INTRAVENOUS

## 2011-05-22 MED ORDER — ACETAMINOPHEN 10 MG/ML IV SOLN: 1000.0000 mg | Freq: Once | INTRAVENOUS | Status: DC

## 2011-05-22 MED ORDER — MIDAZOLAM HCL 5 MG/5ML IJ SOLN
INTRAMUSCULAR | Status: DC | PRN
  Administered 2011-05-22: 2 mg via INTRAVENOUS

## 2011-05-22 MED ORDER — CHLORHEXIDINE GLUCONATE 4 % EX LIQD: 60.0000 mL | Freq: Once | CUTANEOUS | Status: DC

## 2011-05-22 MED ORDER — LACTATED RINGERS IV SOLN: INTRAVENOUS | Status: DC

## 2011-05-22 MED ORDER — ONDANSETRON HCL 4 MG/2ML IJ SOLN
INTRAMUSCULAR | Status: DC | PRN
  Administered 2011-05-22: 4 mg via INTRAVENOUS

## 2011-05-22 MED ORDER — LACTATED RINGERS IV SOLN
INTRAVENOUS | Status: DC
  Administered 2011-05-22: 1000 mL via INTRAVENOUS

## 2011-05-22 MED ORDER — MEPERIDINE HCL 50 MG/ML IJ SOLN
INTRAMUSCULAR | Status: AC
  Filled 2011-05-22: qty 1

## 2011-05-22 MED ORDER — FENTANYL CITRATE 0.05 MG/ML IJ SOLN
25.0000 ug | INTRAMUSCULAR | Status: DC | PRN
  Administered 2011-05-22 (×3): 50 ug via INTRAVENOUS

## 2011-05-22 MED ORDER — SODIUM CHLORIDE 0.9 % IV SOLN: INTRAVENOUS | Status: DC

## 2011-05-22 MED ORDER — MEPERIDINE HCL 50 MG/ML IJ SOLN
6.2500 mg | INTRAMUSCULAR | Status: DC | PRN
Start: 2011-05-22 — End: 2011-05-22
  Administered 2011-05-22: 6.25 mg via INTRAVENOUS

## 2011-05-22 SURGICAL SUPPLY — 12 items
BANDAGE ADHESIVE 1X3 (GAUZE/BANDAGES/DRESSINGS) IMPLANT
CLOTH BEACON ORANGE TIMEOUT ST (SAFETY) ×2 IMPLANT
GLOVE BIO SURGEON STRL SZ7.5 (GLOVE) ×2 IMPLANT
GOWN STRL NON-REIN LRG LVL3 (GOWN DISPOSABLE) ×2 IMPLANT
GOWN STRL REIN XL XLG (GOWN DISPOSABLE) ×2 IMPLANT
MANIFOLD NEPTUNE II (INSTRUMENTS) ×2 IMPLANT
NDL SAFETY ECLIPSE 18X1.5 (NEEDLE) IMPLANT
NEEDLE HYPO 18GX1.5 SHARP (NEEDLE)
POSITIONER SURGICAL ARM (MISCELLANEOUS) ×2 IMPLANT
SPONGE GAUZE 4X4 12PLY (GAUZE/BANDAGES/DRESSINGS) IMPLANT
SYR CONTROL 10ML LL (SYRINGE) IMPLANT
TOWEL OR 17X26 10 PK STRL BLUE (TOWEL DISPOSABLE) ×4 IMPLANT

## 2011-05-22 NOTE — Op Note (Signed)
  OPERATIVE REPORT   PREOPERATIVE DIAGNOSIS: Arthrofibrosis, Right  knee.   POSTOPERATIVE DIAGNOSIS: Arthrofibrosis, Right knee.   PROCEDURE:  Right  knee closed manipulation.   SURGEON: Ollen Gross, MD   ASSISTANT: None.   ANESTHESIA: General.   COMPLICATIONS: None.   CONDITION: Stable to Recovery.   Pre-manipulation range of motion is 10-85.  Post-manipulation range of  Motion is 5-115  Brief Clinical Note- Beth Burke is a 71 yo female s/p right TKA approximately 2 months ago who has had significant limitations in knee ROM despite physical therapy. She has not progressed in the past month and presents for closed manipulation.  PROCEDURE IN DETAIL: After successful administration of general  anesthetic, exam under anesthesia was performed showing range of motion  10-85 degrees. I then placed my chest against the proximal tibia,  flexing the knee with audible lysis of adhesions. I was easily able to  get the knee flexed to 115  degrees. I then put the knee back in extension and with some  patellar manipulation and gentle pressure got within 5 degrees of full  Extension.The patient was subsequently awakened and transported to Recovery in  stable condition.  Beth Burke Beth Bastyr, MD    05/22/2011, 4:54 PM

## 2011-05-22 NOTE — Anesthesia Preprocedure Evaluation (Addendum)
Anesthesia Evaluation  Patient identified by MRN, date of birth, ID band Patient awake    Reviewed: Allergy & Precautions, H&P , NPO status , Patient's Chart, lab work & pertinent test results  Airway Mallampati: II TM Distance: >3 FB Neck ROM: full    Dental No notable dental hx. (+) Teeth Intact and Dental Advisory Given   Pulmonary neg pulmonary ROS, sleep apnea ,  Mild sleep apnea breath sounds clear to auscultation  Pulmonary exam normal       Cardiovascular Exercise Tolerance: Good hypertension, Pt. on medications negative cardio ROS  Rhythm:regular Rate:Normal     Neuro/Psych Anxiety vertigo negative neurological ROS  negative psych ROS   GI/Hepatic negative GI ROS, Neg liver ROS,   Endo/Other  negative endocrine ROS  Renal/GU negative Renal ROS  negative genitourinary   Musculoskeletal   Abdominal   Peds  Hematology negative hematology ROS (+)   Anesthesia Other Findings   Reproductive/Obstetrics negative OB ROS                           Anesthesia Physical Anesthesia Plan  ASA: II  Anesthesia Plan: General   Post-op Pain Management:    Induction: Intravenous  Airway Management Planned: Mask  Additional Equipment:   Intra-op Plan:   Post-operative Plan:   Informed Consent: I have reviewed the patients History and Physical, chart, labs and discussed the procedure including the risks, benefits and alternatives for the proposed anesthesia with the patient or authorized representative who has indicated his/her understanding and acceptance.   Dental advisory given  Plan Discussed with: Surgeon  Anesthesia Plan Comments:         Anesthesia Quick Evaluation

## 2011-05-22 NOTE — Transfer of Care (Signed)
Immediate Anesthesia Transfer of Care Note  Patient: Beth Burke  Procedure(s) Performed: Procedure(s) (LRB): CLOSED MANIPULATION KNEE (Right)  Patient Location: PACU  Anesthesia Type: General  Level of Consciousness: awake, alert , oriented and patient cooperative  Airway & Oxygen Therapy: Patient Spontanous Breathing and Patient connected to face mask oxygen  Post-op Assessment: Report given to PACU RN and Post -op Vital signs reviewed and stable  Post vital signs: Reviewed and stable  Complications: No apparent anesthesia complications

## 2011-05-22 NOTE — Preoperative (Signed)
Beta Blockers   Reason not to administer Beta Blockers:Not Applicable 

## 2011-05-22 NOTE — Interval H&P Note (Signed)
History and Physical Interval Note:  05/22/2011 4:20 PM  Beth Burke  has presented today for surgery, with the diagnosis of right knee arthrofibrosis   The various methods of treatment have been discussed with the patient and family. After consideration of risks, benefits and other options for treatment, the patient has consented to  Procedure(s) (LRB): CLOSED MANIPULATION KNEE (Right) as a surgical intervention .  The patients' history has been reviewed, patient examined, no change in status, stable for surgery.  I have reviewed the patients' chart and labs.  Questions were answered to the patient's satisfaction.     Loanne Drilling

## 2011-05-22 NOTE — Anesthesia Postprocedure Evaluation (Signed)
  Anesthesia Post-op Note  Patient: Beth Burke  Procedure(s) Performed: Procedure(s) (LRB): CLOSED MANIPULATION KNEE (Right)  Patient Location: PACU  Anesthesia Type: General  Level of Consciousness: awake and alert   Airway and Oxygen Therapy: Patient Spontanous Breathing  Post-op Pain: mild  Post-op Assessment: Post-op Vital signs reviewed, Patient's Cardiovascular Status Stable, Respiratory Function Stable, Patent Airway and No signs of Nausea or vomiting  Post-op Vital Signs: stable  Complications: No apparent anesthesia complications

## 2011-05-26 ENCOUNTER — Encounter (HOSPITAL_COMMUNITY): Payer: Self-pay | Admitting: Orthopedic Surgery

## 2011-07-27 ENCOUNTER — Other Ambulatory Visit: Payer: Self-pay | Admitting: Internal Medicine

## 2011-07-27 DIAGNOSIS — R1032 Left lower quadrant pain: Secondary | ICD-10-CM

## 2011-07-28 ENCOUNTER — Ambulatory Visit
Admission: RE | Admit: 2011-07-28 | Discharge: 2011-07-28 | Disposition: A | Discharge status: Home / Self Care | Payer: Medicare Other | Source: Ambulatory Visit | Attending: Internal Medicine | Admitting: Internal Medicine

## 2011-07-28 DIAGNOSIS — R1032 Left lower quadrant pain: Secondary | ICD-10-CM

## 2012-10-03 ENCOUNTER — Other Ambulatory Visit: Payer: Self-pay | Admitting: Internal Medicine

## 2012-10-08 ENCOUNTER — Ambulatory Visit
Admission: RE | Admit: 2012-10-08 | Discharge: 2012-10-08 | Disposition: A | Discharge status: Home / Self Care | Payer: Medicare Other | Source: Ambulatory Visit | Attending: Internal Medicine | Admitting: Internal Medicine

## 2013-10-31 ENCOUNTER — Encounter: Payer: Self-pay | Admitting: Neurology

## 2013-11-06 ENCOUNTER — Encounter: Payer: Self-pay | Admitting: Neurology

## 2013-11-06 ENCOUNTER — Ambulatory Visit (INDEPENDENT_AMBULATORY_CARE_PROVIDER_SITE_OTHER): Payer: Medicare Other | Admitting: Neurology

## 2013-11-06 VITALS — BP 142/80 | HR 72 | Resp 16 | Ht 62.0 in | Wt 131.0 lb

## 2013-11-06 DIAGNOSIS — R0683 Snoring: Secondary | ICD-10-CM

## 2013-11-06 DIAGNOSIS — R5383 Other fatigue: Secondary | ICD-10-CM

## 2013-11-06 DIAGNOSIS — M2619 Other specified anomalies of jaw-cranial base relationship: Secondary | ICD-10-CM

## 2013-11-06 DIAGNOSIS — F329 Major depressive disorder, single episode, unspecified: Secondary | ICD-10-CM

## 2013-11-06 DIAGNOSIS — F32A Depression, unspecified: Secondary | ICD-10-CM | POA: Insufficient documentation

## 2013-11-06 NOTE — Patient Instructions (Signed)
Polysomnography (Sleep Studies) Polysomnography (PSG) is a series of tests used for detecting (diagnosing) obstructive sleep apnea and other sleep disorders. The tests measure how some parts of your body are working while you are sleeping. The tests are extensive and expensive. They are done in a sleep lab or hospital, and vary from center to center. Your caregiver may perform other more simple sleep studies and questionnaires before doing more complete and involved testing. Testing may not be covered by insurance. Some of these tests are:  An EEG (Electroencephalogram). This tests your brain waves and stages of sleep.  An EOG (Electrooculogram). This measures the movements of your eyes. It detects periods of REM (rapid eye movement) sleep, which is your dream sleep.  An EKG (Electrocardiogram). This measures your heart rhythm.  EMG (Electromyography). This is a measurement of how the muscles are working in your upper airway and your legs while sleeping.  An oximetry measurement. It measures how much oxygen (air) you are getting while sleeping.  Breathing efforts may be measured. The same test can be interpreted (understood) differently by different caregivers and centers that study sleep.  Studies may be given an apnea/hypopnea index (AHI). This is a number which is found by counting the times of no breathing or under breathing during the night, and relating those numbers to the amount of time spent in bed. When the AHI is greater than 15, the patient is likely to complain of daytime sleepiness. When the AHI is greater than 30, the patient is at increased risk for heart problems and must be followed more closely. Following the AHI also allows you to know how treatment is working. Simple oximetry (tracking the amount of oxygen that is taken in) can be used for screening patients who:  Do not have symptoms (problems) of OSA.  Have a normal Epworth Sleepiness Scale Score.  Have a low pre-test  probability of having OSA.  Have none of the upper airway problems likely to cause apnea.  Oximetry is also used to determine if treatment is effective in patients who showed significant desaturations (not getting enough oxygen) on their home sleep study. One extra measure of safety is to perform additional studies for the person who only snores. This is because no one can predict with absolute certainty who will have OSA. Those who show significant desaturations (not getting enough oxygen) are recommended to have a more detailed sleep study. Document Released: 07/19/2002 Document Revised: 04/06/2011 Document Reviewed: 03/20/2013 [employer] Patient Information 2015 [employer]. This information is not intended to replace advice given to you by your health care provider. Make sure you discuss any questions you have with your health care provider.  

## 2013-11-06 NOTE — Progress Notes (Signed)
[facility]EP MED[facility]INE CLINIC   Provider:  Larey Seat, M D  Referring Provider: Jerlyn Ly, MD Primary Care Physician:  Jerlyn Ly, MD  Chief Complaint  Patient presents with  . 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 female , who is seen here as a referral  from Dr. Joylene Draft for a sleep evaluation,   Mr. Galvao reports that she developed depression in 2014 and she tried several medication to treat it.  She had always been anxious. She was unable to fall asleep while her depression was  at peak, she was afraid worried, had a sense of doom. She denied any traumatic experiences at that time. Her husband has dementia and colon cancer.  Dr. Joylene Draft prescribed a sleep aid , Restoril. She is fearful of developing a dependency.  She also has neck pain she had knee replacement surgeries in the past and at night sometimes has cramps aching muscles and diffuse muscle pain. Her allergies may contribute to sleep interruption as she suffers mainly from rhinitis. She has a history of snoring a heart murmur ringing in her ears, feels fatigued, feels that she has decreased energy and does not get enough sleep. During the day she is sleepy,  at night she fights insomnia. She goes to bed at 10.30 PM and it will take about 2 hours to go to sleep. 'If I have something on my mind, I just can't sleep". She rises at  6.30 /7.00 Am and feels unrested, unrestored and  has been laying awake for hours.  Overall in a night she estimated 4-5 hours of sleep only.  If she sleeps 2 hours longer she feels better.  She has a small lower jaw, and snores loudly , as witnessed by her son on a family trip in 2007 . She was tested for apnea and was told it was mild.   She has no shift work history, no upper airway, throat or sinus surgery. No history trauma.   Has allergic rhinitis, all year round.  Had cervical anterior fusion.      Review of Systems: Out of a complete 14 system review, the  patient complains of only the following symptoms, and all other reviewed systems are negative. She also has neck pain she had knee replacement surgeries and at night sometimes has cramps, aching muscles and diffuse muscle pain. Her allergies may contribute to sleep interruption as she suffers mainly from rhinitis. She has a history of snoring,  a heart murmur,  ringing in her ears, feels fatigued, feels that she has decreased energy and does not get enough sleep.   Epworth score 4  , Fatigue severity score 18  , depression score 1   History   Social History  . Marital Status: Married    Spouse Name: N/A    Number of Children: 2  . Years of Education: Hotel manager   Occupational History  . Semi-Retired   . Hairstylist    Social History Main Topics  . Smoking status: Never Smoker   . Smokeless tobacco: Never Used  . Alcohol Use: No  . Drug Use: No  . Sexual Activity: Not on file   Other Topics Concern  . Not on file   Social History Narrative  . No narrative on file    Family History  Problem Relation Age of Onset  . Allergies Mother   . Hypertension Mother   . Gout Mother   .  Cancer - Other Brother   . Hypertension Brother   . Asthma Brother   . Heart attack Maternal Aunt     Past Medical History  Diagnosis Date  . Hypertension   . Heart murmur   . Varicose veins   . Elevated cholesterol   . GERD (gastroesophageal reflux disease)   . Vertigo   . Anxiety   . 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 Scarlet fever as child  . Arthritis   . Urinary incontinence     wears pads  . Blood transfusion     WITH KNEE REPLACEMENT 01/2011    Past Surgical History  Procedure Laterality Date  . Cervical fusion  april 2012    C5 to C7 with plates--PT HAS LIMITED ROM AND PAIN  . Right lower leg surgery  1962    wire placed for crushed leg   . Abdominal hysterectomy  1984  . 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 Knee closed reduction  05/22/2011    Procedure: CLOSED MANIPULATION KNEE;  Surgeon: Gearlean Alf, MD;  Location: [facility];  Service: Orthopedics;  Laterality: Right;    Current Outpatient Prescriptions  Medication Sig Dispense Refill  . amLODipine (NORVASC) 5 MG tablet Take 5 mg by mouth daily after breakfast.       . aspirin EC 81 MG tablet Take 81 mg by mouth at bedtime.      . benazepril (LOTENSIN) 20 MG tablet Take 20 mg by mouth at bedtime.      . Calcium Carbonate-Vit D-Min (CALTRATE 600+D PLUS PO) Take 1 tablet by mouth daily.      . Cholecalciferol (VITAMIN D) 2000 UNITS tablet Take 2,000 Units by mouth daily.      . fexofenadine (ALLEGRA) 180 MG tablet Take 180 mg by mouth daily as needed. Allergies       . fluticasone (FLONASE) 50 MCG/ACT nasal spray Place 2 sprays into the nose daily as needed. Allergies      . hydroxypropyl methylcellulose (ISOPTO TEARS) 2.5 % ophthalmic solution Place 1 drop into both eyes every evening.       Marland Kitchen ibuprofen (ADVIL,MOTRIN) 200 MG tablet Take 600 mg by mouth every 6 (six) hours as needed. Pain       . Multiple Vitamin (MULITIVITAMIN WITH MINERALS) TABS Take 1 tablet by mouth daily.      Marland Kitchen omega-3 acid ethyl esters (LOVAZA) 1 G capsule Take 1 g by mouth daily.      Marland Kitchen omeprazole (PRILOSEC) 20 MG capsule Take 20 mg by mouth daily as needed. Heart burn WOULD TAKE IN THE AFTERNOON IF NEEDED      . simvastatin (ZOCOR) 10 MG tablet Take 10 mg by mouth at bedtime.       Marland Kitchen venlafaxine XR (EFFEXOR-XR) 75 MG 24 hr capsule Take 75 mg by mouth daily.      Marland Kitchen LORazepam (ATIVAN) 0.5 MG tablet Take by mouth every 8 (eight) hours as needed. Anxiety  WOULD TAKE 1/2 TO 1 TAB IF NEEDED       . mirtazapine (REMERON) 15 MG tablet Take 15 mg by mouth at bedtime.       No current facility-administered medications for this visit.    Allergies as of 11/06/2013 - Review Complete  11/06/2013  Allergen Reaction Noted  .  Codeine Nausea And Vomiting 05/19/2011  . Oxycodone Nausea And Vomiting 05/20/2011    Vitals: BP 142/80  Pulse 72  Resp 16  Ht 5\' 2"  (1.575 m)  Wt 131 lb (59.421 kg)  BMI 23.95 kg/m2 Last Weight:  Wt Readings from Last 1 Encounters:  11/06/13 131 lb (59.421 kg)       Last Height:   Ht Readings from Last 1 Encounters:  11/06/13 5\' 2"  (1.575 m)    Physical exam:  General: The patient is awake, alert and appears not in acute distress. The patient is well groomed. Head: Normocephalic, atraumatic. Neck is supple. Mallampati 2 with retrognathia.   neck circumference:13.5 . 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 Retrognathia is seen.  Cardiovascular:  Regular rate and rhythm, without  murmurs or carotid bruit, and without distended neck veins. Respiratory: Lungs are clear to auscultation. Skin:  Without evidence of edema, or rash Trunk:  This slender patient has normal posture.  Neurologic exam : The patient is awake and alert, oriented to place and time.   Memory subjective  described as intact. There is a normal attention span & concentration ability.  Speech is fluent without dysarthria, dysphonia or aphasia. Mood and affect are appropriate.  Cranial nerves: Pupils are equal and briskly reactive to light. Funduscopic exam without  evidence of pallor or edema.  Extraocular movements  in vertical and horizontal planes intact and without nystagmus. Visual fields by finger perimetry are intact. Hearing to finger rub intact.  Facial sensation intact to fine touch. Facial motor strength is symmetric and tongue and uvula move midline.  Motor exam:   Normal tone, muscle bulk and symmetric ,strength in all extremities.  Sensory:  Fine touch, pinprick and vibration were tested in all extremities.  Proprioception is tested in the upper extremities only. This was  normal.  Coordination: Rapid alternating movements in the  fingers/hands is normal.  Finger-to-nose maneuver normal without evidence of ataxia, dysmetria or tremor.  Gait and station: Patient walks without assistive device and is able unassisted to climb up to the exam table. Strength within normal limits.  Stance is stable and normal. Tandem gait is unfragmented. Romberg testing is  negative.  Deep tendon reflexes: in the  upper and lower extremities are symmetric and intact. Babinski maneuver response is downgoing.   Assessment:  After physical and neurologic examination, review of laboratory studies, imaging, neurophysiology testing and pre-existing records, assessment is   1) patient with subjective high fatigue, retrognathia and witnessed, loud snoring.    The patient was advised of the nature of the diagnosed sleep disorder ,  the treatment options and risks for general a health and wellness arising from not treating the condition. Visit duration was 30 minutes.   Plan:  Treatment plan and additional workup :  2) evaluate in a sleep study- split, for OSA versus UARS.    She may be candidate for a dental device.      Asencion Partridge Darvis Croft MD  11/06/2013

## 2013-12-18 ENCOUNTER — Ambulatory Visit (INDEPENDENT_AMBULATORY_CARE_PROVIDER_SITE_OTHER): Payer: Medicare Other | Admitting: Neurology

## 2013-12-18 VITALS — BP 137/72 | HR 73

## 2013-12-18 DIAGNOSIS — R0683 Snoring: Secondary | ICD-10-CM

## 2013-12-18 DIAGNOSIS — R5383 Other fatigue: Secondary | ICD-10-CM

## 2013-12-18 DIAGNOSIS — F32A Depression, unspecified: Secondary | ICD-10-CM

## 2013-12-18 DIAGNOSIS — M2619 Other specified anomalies of jaw-cranial base relationship: Secondary | ICD-10-CM

## 2013-12-18 DIAGNOSIS — F329 Major depressive disorder, single episode, unspecified: Secondary | ICD-10-CM

## 2013-12-18 DIAGNOSIS — G4733 Obstructive sleep apnea (adult) (pediatric): Secondary | ICD-10-CM

## 2013-12-19 NOTE — Sleep Study (Signed)
Please see the scanned sleep study interpretation located in the Procedure tab within the Chart review section

## 2013-12-26 DIAGNOSIS — G4733 Obstructive sleep apnea (adult) (pediatric): Secondary | ICD-10-CM | POA: Insufficient documentation

## 2013-12-27 ENCOUNTER — Telehealth: Payer: Self-pay | Admitting: *Deleted

## 2013-12-27 ENCOUNTER — Encounter: Payer: Self-pay | Admitting: Neurology

## 2013-12-27 NOTE — Telephone Encounter (Signed)
Patient was contacted and provided the results of her sleep study which revealed obstructive sleep apnea.  Patient was informed that treatment was advised.  It was explained in detail the risks of untreated sleep apnea yet the patient was unwilling to schedule her recommended CPAP titration study.  She stated she wanted to think about it.  The patient gave verbal permission to mail a copy of her test results.  Dr. Joylene Draft was faxed a copy of the report.

## 2014-07-17 ENCOUNTER — Telehealth: Payer: Self-pay | Admitting: Neurology

## 2014-07-17 DIAGNOSIS — G4733 Obstructive sleep apnea (adult) (pediatric): Secondary | ICD-10-CM

## 2014-07-17 NOTE — Telephone Encounter (Signed)
Spoke to pt, Dr. Brett Fairy agreed to just put in the order for cpap titration, no f/u needed until after the titration study. Pt verbalized understanding.

## 2014-07-17 NOTE — Telephone Encounter (Signed)
Pt called office stating that she had a split study 12/18/2013 and was advised to proceed with cpap titration. Pt was unable to establish cpap titration and asked to hold off the request due to her husband receiving chemo and she was uncomfortable with leaving him home alone overnight at the time. Pt has met with her PCP and was advised to proceed at this time. Pt would like to know if a follow up is needed to proceed? Or if the order can be placed at this time? Please review this request and advise.

## 2014-08-27 ENCOUNTER — Ambulatory Visit (INDEPENDENT_AMBULATORY_CARE_PROVIDER_SITE_OTHER): Payer: Medicare Other | Admitting: Neurology

## 2014-08-27 DIAGNOSIS — G4733 Obstructive sleep apnea (adult) (pediatric): Secondary | ICD-10-CM

## 2014-08-27 NOTE — Sleep Study (Signed)
Please see the scanned sleep study interpretation located in the Procedure tab within the Chart Review section. 

## 2014-08-29 ENCOUNTER — Telehealth: Payer: Self-pay

## 2014-08-29 NOTE — Telephone Encounter (Signed)
Gave pt her sleep study results. I advised her that Dr. Brett Fairy recommends starting a cpap, however, there are reports from the pt of cpap intolerance. I advised pt that a cpap desensitization protocol will be done for pt, and only if the desensitization is successful, will the cpap be ordered.  Pt says that at this time in her life, she doesn't think the cpap is the best option. She says that she did not sleep at all during her study. She wants to discuss it further with Dr. Brett Fairy before making an ultimate decision. She says she has a lot on her plate right now. I advised the pt again that her AHI was over 20 in her first sleep study, so she does have osa and it needs to be treated. She asked for an appt in September.. An appt was made on 9/6 at 1:00 with the pt and she verbalized understanding to arrive at 12:45.

## 2014-10-02 ENCOUNTER — Ambulatory Visit (INDEPENDENT_AMBULATORY_CARE_PROVIDER_SITE_OTHER): Payer: Medicare Other | Admitting: Neurology

## 2014-10-02 ENCOUNTER — Encounter: Payer: Self-pay | Admitting: Neurology

## 2014-10-02 VITALS — BP 134/75 | HR 66 | Resp 18 | Ht 62.0 in | Wt 130.5 lb

## 2014-10-02 DIAGNOSIS — G4733 Obstructive sleep apnea (adult) (pediatric): Secondary | ICD-10-CM | POA: Diagnosis not present

## 2014-10-02 NOTE — Progress Notes (Signed)
SLEEP MEDICINE CLINIC   Provider:  Larey Seat, M D  Referring Provider: Crist Infante, MD Primary Care Physician:  Jerlyn Ly, MD  Chief Complaint  Patient presents with  . Follow-up    HPI:  Beth Burke is a 74 y.o. female , who is seen here as a referral  from Dr. Joylene Draft for a sleep evaluation,   Beth Burke reports that she developed depression in 2014 and she tried several medication to treat it.  She had always been anxious. She was unable to fall asleep while her depression was  at peak, she was afraid worried, had a sense of doom. She denied any traumatic experiences at that time. Her husband has dementia and colon cancer, which is progressive..  Dr. Joylene Draft prescribed a sleep aid , Restoril. She is fearful of developing a dependency.  She also has neck pain she had knee replacement surgeries in the past and at night sometimes has cramps aching muscles and diffuse muscle pain. Her allergies may contribute to sleep interruption as she suffers mainly from rhinitis. She has a history of snoring , heart murmur ,ringing in her ears, feeling fatigued, decreased energy and does not get enough sleep. During the day she is sleepy but at night she fights insomnia. She goes to bed at 10.30 PM and it will take about 2 hours to go to sleep. 'If I have something on my mind, I just can't sleep". She rises between   6.30 /7.00 Amand feels unrested, unrestored and  has been laying awake for hours.  Overall in a night she estimated 4-5 hours of sleep only, reportedly she feels better if  she sleeps 2 hours longer .She has a small lower jaw, and snores loudly , as witnessed by her son on a family trip in 2007 . She was tested for apnea and was told it was mild. She has no shift work history, no upper airway, throat or sinus surgery. No history trauma.   Has allergic rhinitis, all year round.  Had cervical anterior fusion.   Interval history from 10-02-14, [name] returns today after her sleep  study from 2015. 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 The AHI was 20.8 and the RDI 25.3 the REM AHI was 36.5 supine AHI was 47. Given the REM predominance of her apnea it was decided that she probably would need a CPAP machine. She had significant oxygen desaturations and the needle oxygen nadir was 78%. 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  Returned for a CPAP titration on 08-27-14. The return was delayed because of multiple social and medical issues with immediate family members. Her husband's dementia and colon cancer  have progressed. The CPAP titration was successful and she reached an AHI of 0.0. She had some issues with claustrophobia and anxiety and reported that she felt intolerant of the CPAP.  I had ordered a CPAP desensitization protocol to be done through the DME. We could also try it here in the [facility] attached sleep-lab. She reported she was not willing to do that, she slept already better.     Review of Systems: Out of a complete 14 system review, the patient complains of only the following symptoms, and all other reviewed systems are negative. She also has neck pain she had knee replacement surgeries and at night sometimes has cramps, aching muscles and  diffuse muscle pain. Her allergies may contribute to sleep interruption as she suffers mainly from rhinitis. She has a history of snoring,  a heart murmur,  ringing in her ears, feels fatigued, feels that she has decreased energy and does not get enough sleep.   Epworth score 3 from 4  , Fatigue severity score 7 from 18  , depression score 1   Social History   Social History  . Marital Status: Married    Spouse Name: N/A  . Number of Children: 2  . Years of Education: technical   Occupational History  . Semi-Retired   . Hairstylist    Social History Main Topics  . Smoking status: Never  Smoker   . Smokeless tobacco: Never Used  . Alcohol Use: No  . Drug Use: No  . Sexual Activity: Not on file   Other Topics Concern  . Not on file   Social History Narrative    Family History  Problem Relation Age of Onset  . Allergies Mother   . Hypertension Mother   . Gout Mother   . Cancer - Other Brother   . Hypertension Brother   . Asthma Brother   . Heart attack Maternal Aunt     Past Medical History  Diagnosis Date  . Hypertension   . Heart murmur   . Varicose veins   . Elevated cholesterol   . GERD (gastroesophageal reflux disease)   . Vertigo   . Anxiety   . 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 Scarlet fever as child  . Arthritis   . Urinary incontinence     wears pads  . Blood transfusion     WITH KNEE REPLACEMENT 01/2011    Past Surgical History  Procedure Laterality Date  . Cervical fusion  april 2012    C5 to C7 with plates--PT HAS LIMITED ROM AND PAIN  . Right lower leg surgery  1962    wire placed for crushed leg   . Abdominal hysterectomy  1984  . 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 Knee closed reduction  05/22/2011    Procedure: CLOSED MANIPULATION KNEE;  Surgeon: Gearlean Alf, MD;  Location: [facility];  Service: Orthopedics;  Laterality: Right;    Current Outpatient Prescriptions  Medication Sig Dispense Refill  . amLODipine (NORVASC) 5 MG tablet Take 5 mg by mouth daily after breakfast.     . aspirin EC 81 MG tablet Take 81 mg by mouth at bedtime.    . benazepril (LOTENSIN) 40 MG tablet Take 1 tablet by mouth daily.    . Calcium Carbonate-Vit D-Min (CALTRATE 600+D PLUS PO) Take 1 tablet by mouth daily.    . Cholecalciferol (VITAMIN D) 2000 UNITS tablet Take 2,000 Units by mouth daily.    . fexofenadine (ALLEGRA) 180 MG tablet Take 180 mg by mouth daily as needed. Allergies     . fluticasone  (FLONASE) 50 MCG/ACT nasal spray Place 2 sprays into the nose daily as needed. Allergies    . hydroxypropyl methylcellulose (ISOPTO TEARS) 2.5 % ophthalmic solution Place 1 drop into both eyes every evening.     Marland Kitchen ibuprofen (ADVIL,MOTRIN) 200 MG tablet Take 600 mg by mouth every 6 (six) hours as needed. Pain     . LORazepam (ATIVAN) 0.5 MG tablet Take by mouth every 8 (  eight) hours as needed. Anxiety  WOULD TAKE 1/2 TO 1 TAB IF NEEDED     . mirtazapine (REMERON) 15 MG tablet Take 15 mg by mouth at bedtime.    . Multiple Vitamin (MULITIVITAMIN WITH MINERALS) TABS Take 1 tablet by mouth daily.    Marland Kitchen omega-3 acid ethyl esters (LOVAZA) 1 G capsule Take 1 g by mouth daily.    Marland Kitchen omeprazole (PRILOSEC) 20 MG capsule Take 20 mg by mouth daily as needed. Heart burn WOULD TAKE IN THE AFTERNOON IF NEEDED    . simvastatin (ZOCOR) 10 MG tablet Take 10 mg by mouth at bedtime.     Marland Kitchen venlafaxine XR (EFFEXOR-XR) 75 MG 24 hr capsule Take 75 mg by mouth daily.     No current facility-administered medications for this visit.    Allergies as of 10/02/2014 - Review Complete 10/02/2014  Allergen Reaction Noted  . Codeine Nausea And Vomiting 05/19/2011  . Oxycodone Nausea And Vomiting 05/20/2011    Vitals: BP 134/75 mmHg  Pulse 66  Resp 18  Ht 5\' 2"  (1.575 m)  Wt 130 lb 8 oz (59.194 kg)  BMI 23.86 kg/m2 Last Weight:  Wt Readings from Last 1 Encounters:  10/02/14 130 lb 8 oz (59.194 kg)       Last Height:   Ht Readings from Last 1 Encounters:  10/02/14 5\' 2"  (1.575 m)    Physical exam:  General: The patient is awake, alert and appears not in acute distress. The patient is well groomed. Head: Normocephalic, atraumatic. Neck is supple. Mallampati 2 with retrognathia.   neck circumference:13.5 . 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 Retrognathia is seen.  Cardiovascular:  Regular rate and rhythm, without  murmurs or carotid bruit, and without distended neck  veins. Respiratory: Lungs are clear to auscultation. Skin:  Without evidence of edema, or rash Trunk:  This slender patient has normal posture.  Neurologic exam : The patient is awake and alert, oriented to place and time.   Memory subjective  described as intact. There is a normal attention span & concentration ability.  Speech is fluent without dysarthria, dysphonia or aphasia. Mood and affect are appropriate.  Cranial nerves: Pupils are equal and briskly reactive to light. Funduscopic exam without  evidence of pallor or edema.  Extraocular movements  in vertical and horizontal planes intact and without nystagmus. Visual fields by finger perimetry are intact. Hearing to finger rub intact. Facial sensation intact to fine touch. Facial motor strength is symmetric and tongue and uvula move midline. Motor exam:   Normal tone, muscle bulk and symmetric ,strength in all extremities. Sensory:  Fine touch, pinprick and vibration were tested in all extremities.  Proprioception is tested in the upper extremities only. This was  normal. Coordination: Rapid alternating movements in the fingers/hands is normal.  Finger-to-nose maneuver normal without evidence of ataxia, dysmetria or tremor. Gait and station: Patient walks without assistive device . Deep tendon reflexes: in the upper and lower extremities are symmetric and intact.   Assessment:  After physical and neurologic examination, review of laboratory studies, imaging, neurophysiology testing and pre-existing records, assessment is   1) patient with subjective high fatigue, retrognathia and witnessed, loud snoring. AHI 20, REM and supine accentuated.    The patient was advised of the nature of the diagnosed sleep disorder ,  the treatment options and risks for general a health and wellness arising from not treating the condition.   More than 50% of the face  to face time was spent in discussion of treatment options, risk factors and  Natural  progression of the disorder, the Visit duration was 20 minutes. I brought her over to the sleep lab for desensitization. Should this fail, I will accept her as CPAP intolerant.   Plan:  Treatment plan and additional workup : desensitization to CPAP.      Asencion Partridge Jeriel Vivanco MD  10/02/2014

## 2016-02-24 ENCOUNTER — Ambulatory Visit: Payer: Medicare Other | Admitting: Podiatry

## 2016-03-20 ENCOUNTER — Encounter: Payer: Self-pay | Admitting: Podiatry

## 2016-03-20 ENCOUNTER — Ambulatory Visit (INDEPENDENT_AMBULATORY_CARE_PROVIDER_SITE_OTHER): Payer: Medicare Other | Admitting: Podiatry

## 2016-03-20 ENCOUNTER — Ambulatory Visit (INDEPENDENT_AMBULATORY_CARE_PROVIDER_SITE_OTHER): Payer: Medicare Other

## 2016-03-20 VITALS — Resp 16 | Ht 62.5 in | Wt 120.0 lb

## 2016-03-20 DIAGNOSIS — M779 Enthesopathy, unspecified: Secondary | ICD-10-CM | POA: Diagnosis not present

## 2016-03-20 DIAGNOSIS — M79671 Pain in right foot: Secondary | ICD-10-CM

## 2016-03-20 DIAGNOSIS — M79672 Pain in left foot: Secondary | ICD-10-CM

## 2016-03-20 DIAGNOSIS — M21619 Bunion of unspecified foot: Secondary | ICD-10-CM

## 2016-03-20 MED ORDER — TRIAMCINOLONE ACETONIDE 10 MG/ML IJ SUSP
10.0000 mg | Freq: Once | INTRAMUSCULAR | Status: AC
  Administered 2016-03-20: 10 mg

## 2016-03-20 NOTE — Progress Notes (Signed)
   Subjective:    Patient ID: Beth Burke, female    DOB: 11/12/1940, 76 y.o.   MRN: [id number]  HPI  Chief Complaint  Patient presents with  . Foot Pain    BL;Top of foot radiating to toes x 2 months.   . Bunions    BL  . Toe Pain    Right; Great Toe, Pt stated that when she wears shoes the toe hurts.       Review of Systems     Objective:   Physical Exam        Assessment & Plan:

## 2016-03-23 NOTE — Progress Notes (Signed)
Subjective:     Patient ID: Beth Burke, female   DOB: 1940/05/04, 76 y.o.   MRN: [id number]  HPI patient presents concerned about bunions and discomfort that she has with inflamed tendon   Review of Systems     Objective:   Physical Exam Neurovascular status intact muscle strength adequate with inflammation of the tendon complex left foot localized in nature with no proximal edema or other pathology    Assessment:     Inflammatory tendinitis    Plan:     H&P and advised on physical therapy and utilization of supportive shoes with careful steroidal injection and will be seen back again next several weeks or earlier if any issues should occur  X-rays indicate structural bunion deformity with no indications of other pathology with mild osteoporosis

## 2017-02-02 DIAGNOSIS — H2511 Age-related nuclear cataract, right eye: Secondary | ICD-10-CM | POA: Diagnosis not present

## 2017-02-15 DIAGNOSIS — Z1231 Encounter for screening mammogram for malignant neoplasm of breast: Secondary | ICD-10-CM | POA: Diagnosis not present

## 2017-02-22 DIAGNOSIS — H2511 Age-related nuclear cataract, right eye: Secondary | ICD-10-CM | POA: Diagnosis not present

## 2017-03-05 ENCOUNTER — Other Ambulatory Visit: Payer: Self-pay | Admitting: Podiatry

## 2017-03-05 ENCOUNTER — Ambulatory Visit (INDEPENDENT_AMBULATORY_CARE_PROVIDER_SITE_OTHER): Payer: Medicare Other | Admitting: Podiatry

## 2017-03-05 ENCOUNTER — Ambulatory Visit (INDEPENDENT_AMBULATORY_CARE_PROVIDER_SITE_OTHER): Payer: Medicare Other

## 2017-03-05 ENCOUNTER — Encounter: Payer: Self-pay | Admitting: Podiatry

## 2017-03-05 DIAGNOSIS — M21619 Bunion of unspecified foot: Secondary | ICD-10-CM

## 2017-03-05 DIAGNOSIS — M79671 Pain in right foot: Secondary | ICD-10-CM

## 2017-03-05 DIAGNOSIS — M7750 Other enthesopathy of unspecified foot: Secondary | ICD-10-CM | POA: Diagnosis not present

## 2017-03-05 DIAGNOSIS — M7751 Other enthesopathy of right foot: Secondary | ICD-10-CM

## 2017-03-05 DIAGNOSIS — M779 Enthesopathy, unspecified: Secondary | ICD-10-CM

## 2017-03-05 MED ORDER — TRIAMCINOLONE ACETONIDE 10 MG/ML IJ SUSP
10.0000 mg | Freq: Once | INTRAMUSCULAR | Status: AC
  Administered 2017-03-05: 10 mg

## 2017-03-05 NOTE — Patient Instructions (Signed)

## 2017-03-07 NOTE — Progress Notes (Signed)
Subjective:   Patient ID: Beth Burke, female   DOB: 77 y.o.   MRN: [id number]   HPI Patient presents with pain in the right foot with inflammation of the right ankle and also structural bunion deformity right which at times is symptomatic   ROS      Objective:  Physical Exam  Neurovascular status intact with inflammatory tendinitis of the right foot medial side with fluid buildup and also noted to have structural issues around the big toe joint right with hyperostosis inflammation with moderate pain     Assessment:  2 separate problems with one being acute tendinitis and secondarily structural bunion deformity     Plan:  H&P and both conditions discussed.  I did do careful sheath injection 3 mg dexamethasone Kenalog 5 mg Xylocaine after sterile prep of the area and advised on reduced activity.  I then discussed bunion and considerations for correction but I do not recommend that at the current time I would recommend continued wider shoes and soft leather.  Reappoint if symptoms persist or get worse  X-rays indicate that there is structural bunion deformity right with no indications of acute stress fracture or advanced arthritis

## 2017-05-11 DIAGNOSIS — M25552 Pain in left hip: Secondary | ICD-10-CM | POA: Diagnosis not present

## 2017-05-11 DIAGNOSIS — M5136 Other intervertebral disc degeneration, lumbar region: Secondary | ICD-10-CM | POA: Diagnosis not present

## 2017-05-11 DIAGNOSIS — M479 Spondylosis, unspecified: Secondary | ICD-10-CM | POA: Diagnosis not present

## 2017-05-11 DIAGNOSIS — M7062 Trochanteric bursitis, left hip: Secondary | ICD-10-CM | POA: Diagnosis not present

## 2017-06-09 ENCOUNTER — Ambulatory Visit (INDEPENDENT_AMBULATORY_CARE_PROVIDER_SITE_OTHER): Payer: Medicare Other | Admitting: Podiatry

## 2017-06-09 ENCOUNTER — Encounter: Payer: Self-pay | Admitting: Podiatry

## 2017-06-09 DIAGNOSIS — M779 Enthesopathy, unspecified: Secondary | ICD-10-CM

## 2017-06-09 DIAGNOSIS — M21622 Bunionette of left foot: Secondary | ICD-10-CM | POA: Diagnosis not present

## 2017-06-09 MED ORDER — TRIAMCINOLONE ACETONIDE 10 MG/ML IJ SUSP
10.0000 mg | Freq: Once | INTRAMUSCULAR | Status: AC
  Administered 2017-06-09: 10 mg

## 2017-06-09 NOTE — Progress Notes (Signed)
Subjective:   Patient ID: Beth Burke, female   DOB: 77 y.o.   MRN: [id number]   HPI Patient presents stating the injection in January only seem to give partial reduction of her symptoms and the pain is more on top and also she is concerned about lesion on her left foot that is making it increasingly painful and hard to wear shoe gear comfortably   ROS      Objective:  Physical Exam  Neurovascular status intact with patient noted to have discomfort that is now more in the dorsal of the midtarsal joint with medial pain somewhat improved with keratotic lesion and plantar flexion of the fifth metatarsal left with hypertrophy of the bone     Assessment:  Tendinitis dorsal midfoot right with inflammation and 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

## 2017-06-17 DIAGNOSIS — L820 Inflamed seborrheic keratosis: Secondary | ICD-10-CM | POA: Diagnosis not present

## 2017-06-17 DIAGNOSIS — Z85828 Personal history of other malignant neoplasm of skin: Secondary | ICD-10-CM | POA: Diagnosis not present

## 2017-06-17 DIAGNOSIS — L817 Pigmented purpuric dermatosis: Secondary | ICD-10-CM | POA: Diagnosis not present

## 2017-06-17 DIAGNOSIS — L814 Other melanin hyperpigmentation: Secondary | ICD-10-CM | POA: Diagnosis not present

## 2017-06-17 DIAGNOSIS — D1801 Hemangioma of skin and subcutaneous tissue: Secondary | ICD-10-CM | POA: Diagnosis not present

## 2017-06-17 DIAGNOSIS — L821 Other seborrheic keratosis: Secondary | ICD-10-CM | POA: Diagnosis not present

## 2017-06-17 DIAGNOSIS — L570 Actinic keratosis: Secondary | ICD-10-CM | POA: Diagnosis not present

## 2017-06-17 DIAGNOSIS — D692 Other nonthrombocytopenic purpura: Secondary | ICD-10-CM | POA: Diagnosis not present

## 2017-06-17 DIAGNOSIS — I788 Other diseases of capillaries: Secondary | ICD-10-CM | POA: Diagnosis not present

## 2017-07-05 DIAGNOSIS — Z961 Presence of intraocular lens: Secondary | ICD-10-CM | POA: Diagnosis not present

## 2017-07-05 DIAGNOSIS — H35372 Puckering of macula, left eye: Secondary | ICD-10-CM | POA: Diagnosis not present

## 2017-07-05 DIAGNOSIS — H26493 Other secondary cataract, bilateral: Secondary | ICD-10-CM | POA: Diagnosis not present

## 2017-07-12 DIAGNOSIS — H26491 Other secondary cataract, right eye: Secondary | ICD-10-CM | POA: Diagnosis not present

## 2017-07-12 DIAGNOSIS — Z961 Presence of intraocular lens: Secondary | ICD-10-CM | POA: Diagnosis not present

## 2017-07-26 DIAGNOSIS — Z6825 Body mass index (BMI) 25.0-25.9, adult: Secondary | ICD-10-CM | POA: Diagnosis not present

## 2017-07-26 DIAGNOSIS — R600 Localized edema: Secondary | ICD-10-CM | POA: Diagnosis not present

## 2017-07-26 DIAGNOSIS — I8310 Varicose veins of unspecified lower extremity with inflammation: Secondary | ICD-10-CM | POA: Diagnosis not present

## 2017-09-10 ENCOUNTER — Ambulatory Visit (INDEPENDENT_AMBULATORY_CARE_PROVIDER_SITE_OTHER): Payer: Medicare Other | Admitting: Podiatry

## 2017-09-10 ENCOUNTER — Encounter: Payer: Self-pay | Admitting: Podiatry

## 2017-09-10 DIAGNOSIS — M21622 Bunionette of left foot: Secondary | ICD-10-CM

## 2017-09-10 DIAGNOSIS — M779 Enthesopathy, unspecified: Secondary | ICD-10-CM

## 2017-09-10 DIAGNOSIS — M7751 Other enthesopathy of right foot: Secondary | ICD-10-CM

## 2017-09-10 MED ORDER — TRIAMCINOLONE ACETONIDE 10 MG/ML IJ SUSP
10.0000 mg | Freq: Once | INTRAMUSCULAR | Status: AC
  Administered 2017-09-10: 10 mg

## 2017-09-10 NOTE — Patient Instructions (Signed)
Pre-Operative Instructions  Congratulations, you have decided to take an important step towards improving your quality of life.  You can be assured that the doctors and staff at [facility] will be with you every step of the way.  Here are some important things you should know:  1. Plan to be at the surgery center/hospital at least 1 (one) hour prior to your scheduled time, unless otherwise directed by the surgical center/hospital staff.  You must have a responsible adult accompany you, remain during the surgery and drive you home.  Make sure you have directions to the surgical center/hospital to ensure you arrive on time. 2. If you are having surgery at [facility] or [facility] hospitals, you will need a copy of your medical history and physical form from your family physician within one month prior to the date of surgery. We will give you a form for your primary physician to complete.  3. We make every effort to accommodate the date you request for surgery.  However, there are times where surgery dates or times have to be moved.  We will contact you as soon as possible if a change in schedule is required.   4. No aspirin/ibuprofen for one week before surgery.  If you are on aspirin, any non-steroidal anti-inflammatory medications (Mobic, Aleve, Ibuprofen) should not be taken seven (7) days prior to your surgery.  You make take Tylenol for pain prior to surgery.  5. Medications - If you are taking daily heart and blood pressure medications, seizure, reflux, allergy, asthma, anxiety, pain or diabetes medications, make sure you notify the surgery center/hospital before the day of surgery so they can tell you which medications you should take or avoid the day of surgery. 6. No food or drink after midnight the night before surgery unless directed otherwise by surgical center/hospital staff. 7. No alcoholic beverages 24-hours prior to surgery.  No smoking 24-hours prior or 24-hours after  surgery. 8. Wear loose pants or shorts. They should be loose enough to fit over bandages, boots, and casts. 9. Don't wear slip-on shoes. Sneakers are preferred. 10. Bring your boot with you to the surgery center/hospital.  Also bring crutches or a walker if your physician has prescribed it for you.  If you do not have this equipment, it will be provided for you after surgery. 11. If you have not been contacted by the surgery center/hospital by the day before your surgery, call to confirm the date and time of your surgery. 12. Leave-time from work may vary depending on the type of surgery you have.  Appropriate arrangements should be made prior to surgery with your employer. 13. Prescriptions will be provided immediately following surgery by your doctor.  Fill these as soon as possible after surgery and take the medication as directed. Pain medications will not be refilled on weekends and must be approved by the doctor. 14. Remove nail polish on the operative foot and avoid getting pedicures prior to surgery. 15. Wash the night before surgery.  The night before surgery wash the foot and leg well with water and the antibacterial soap provided. Be sure to pay special attention to beneath the toenails and in between the toes.  Wash for at least three (3) minutes. Rinse thoroughly with water and dry well with a towel.  Perform this wash unless told not to do so by your physician.  Enclosed: 1 Ice pack (please put in freezer the night before surgery)   1 Hibiclens skin cleaner     Pre-op instructions  If you have any questions regarding the instructions, please do not hesitate to call our office.  [place]: [address], [place], [place] [address] -- [phone]  [place]: [address] , [place] [address] -- [phone]  [place]: [address]  [place], [place] [address] -- [phone]  [place]: [address], [address], [place], [place] [address] -- [phone]  Website:  [email or web address] 

## 2017-09-13 NOTE — Progress Notes (Signed)
Subjective:   Patient ID: Beth Burke, female   DOB: 77 y.o.   MRN: [id number]   HPI Patient presents stating her left foot is getting very tender on this bone and it is increasingly hard for her to walk comfortably.  States the level of relief she had was minimal from the previous treatment   ROS      Objective:  Physical Exam  Neurovascular status intact with patient found to have inflammation pain around the fifth metatarsal head left with fluid buildup keratotic tissue formation and irritation when palpated.  There is a deep lesion present that she cannot walk on comfortably and the bone is quite exposed on the fifth metatarsal     Assessment:  Chronic lesion sub-fifth metatarsal left with bone prominence of the fifth metatarsal head creating the pathology     Plan:  H&P condition reviewed at great length.  Due to the fact the patient only got several weeks of relief with injection and trimming therapy I have recommended long-term that this will need to be excised and I explained fifth metatarsal head resection to patient.  I allowed her to go over consent form reviewing alternative treatments complications and she understands this would like to do it but needs to wait several months due to her schedule and will call with time and is encouraged also to call us with any questions concerning the procedure or recovery.  She understands total recovery can take approximately 6 months and at this time I did go ahead and I debrided the lesion fully with no iatrogenic bleeding and she again is encouraged to call us but I think at one point due to the chronic nature of this problem it will need correction

## 2017-10-20 DIAGNOSIS — F329 Major depressive disorder, single episode, unspecified: Secondary | ICD-10-CM | POA: Diagnosis not present

## 2017-10-20 DIAGNOSIS — Z23 Encounter for immunization: Secondary | ICD-10-CM | POA: Diagnosis not present

## 2017-10-20 DIAGNOSIS — R51 Headache: Secondary | ICD-10-CM | POA: Diagnosis not present

## 2017-10-20 DIAGNOSIS — Z6824 Body mass index (BMI) 24.0-24.9, adult: Secondary | ICD-10-CM | POA: Diagnosis not present

## 2017-10-20 DIAGNOSIS — G479 Sleep disorder, unspecified: Secondary | ICD-10-CM | POA: Diagnosis not present

## 2017-10-20 DIAGNOSIS — I10 Essential (primary) hypertension: Secondary | ICD-10-CM | POA: Diagnosis not present

## 2017-11-03 DIAGNOSIS — F329 Major depressive disorder, single episode, unspecified: Secondary | ICD-10-CM | POA: Diagnosis not present

## 2017-11-03 DIAGNOSIS — I10 Essential (primary) hypertension: Secondary | ICD-10-CM | POA: Diagnosis not present

## 2017-11-03 DIAGNOSIS — G479 Sleep disorder, unspecified: Secondary | ICD-10-CM | POA: Diagnosis not present

## 2017-11-24 DIAGNOSIS — G479 Sleep disorder, unspecified: Secondary | ICD-10-CM | POA: Diagnosis not present

## 2017-11-24 DIAGNOSIS — Z6823 Body mass index (BMI) 23.0-23.9, adult: Secondary | ICD-10-CM | POA: Diagnosis not present

## 2017-11-24 DIAGNOSIS — F329 Major depressive disorder, single episode, unspecified: Secondary | ICD-10-CM | POA: Diagnosis not present

## 2017-12-13 DIAGNOSIS — L821 Other seborrheic keratosis: Secondary | ICD-10-CM | POA: Diagnosis not present

## 2017-12-13 DIAGNOSIS — L570 Actinic keratosis: Secondary | ICD-10-CM | POA: Diagnosis not present

## 2017-12-13 DIAGNOSIS — L814 Other melanin hyperpigmentation: Secondary | ICD-10-CM | POA: Diagnosis not present

## 2017-12-13 DIAGNOSIS — D225 Melanocytic nevi of trunk: Secondary | ICD-10-CM | POA: Diagnosis not present

## 2017-12-13 DIAGNOSIS — Z85828 Personal history of other malignant neoplasm of skin: Secondary | ICD-10-CM | POA: Diagnosis not present

## 2017-12-21 DIAGNOSIS — F331 Major depressive disorder, recurrent, moderate: Secondary | ICD-10-CM | POA: Diagnosis not present

## 2017-12-21 DIAGNOSIS — F411 Generalized anxiety disorder: Secondary | ICD-10-CM | POA: Diagnosis not present

## 2017-12-22 DIAGNOSIS — Z6823 Body mass index (BMI) 23.0-23.9, adult: Secondary | ICD-10-CM | POA: Diagnosis not present

## 2017-12-22 DIAGNOSIS — F339 Major depressive disorder, recurrent, unspecified: Secondary | ICD-10-CM | POA: Diagnosis not present

## 2017-12-22 DIAGNOSIS — I10 Essential (primary) hypertension: Secondary | ICD-10-CM | POA: Diagnosis not present

## 2017-12-22 DIAGNOSIS — G479 Sleep disorder, unspecified: Secondary | ICD-10-CM | POA: Diagnosis not present

## 2018-01-11 DIAGNOSIS — F411 Generalized anxiety disorder: Secondary | ICD-10-CM | POA: Diagnosis not present

## 2018-01-11 DIAGNOSIS — F331 Major depressive disorder, recurrent, moderate: Secondary | ICD-10-CM | POA: Diagnosis not present

## 2018-01-14 DIAGNOSIS — M810 Age-related osteoporosis without current pathological fracture: Secondary | ICD-10-CM | POA: Diagnosis not present

## 2018-01-14 DIAGNOSIS — I10 Essential (primary) hypertension: Secondary | ICD-10-CM | POA: Diagnosis not present

## 2018-01-14 DIAGNOSIS — E7849 Other hyperlipidemia: Secondary | ICD-10-CM | POA: Diagnosis not present

## 2018-01-14 DIAGNOSIS — R82998 Other abnormal findings in urine: Secondary | ICD-10-CM | POA: Diagnosis not present

## 2018-01-21 DIAGNOSIS — M810 Age-related osteoporosis without current pathological fracture: Secondary | ICD-10-CM | POA: Diagnosis not present

## 2018-01-21 DIAGNOSIS — R51 Headache: Secondary | ICD-10-CM | POA: Diagnosis not present

## 2018-01-21 DIAGNOSIS — Z6822 Body mass index (BMI) 22.0-22.9, adult: Secondary | ICD-10-CM | POA: Diagnosis not present

## 2018-01-21 DIAGNOSIS — Z1389 Encounter for screening for other disorder: Secondary | ICD-10-CM | POA: Diagnosis not present

## 2018-01-21 DIAGNOSIS — F339 Major depressive disorder, recurrent, unspecified: Secondary | ICD-10-CM | POA: Diagnosis not present

## 2018-01-21 DIAGNOSIS — G478 Other sleep disorders: Secondary | ICD-10-CM | POA: Diagnosis not present

## 2018-01-21 DIAGNOSIS — R5383 Other fatigue: Secondary | ICD-10-CM | POA: Diagnosis not present

## 2018-01-21 DIAGNOSIS — I8310 Varicose veins of unspecified lower extremity with inflammation: Secondary | ICD-10-CM | POA: Diagnosis not present

## 2018-01-21 DIAGNOSIS — Z0000 Encounter for general adult medical examination without abnormal findings: Secondary | ICD-10-CM | POA: Diagnosis not present

## 2018-01-21 DIAGNOSIS — M25519 Pain in unspecified shoulder: Secondary | ICD-10-CM | POA: Diagnosis not present

## 2018-01-21 DIAGNOSIS — R0781 Pleurodynia: Secondary | ICD-10-CM | POA: Diagnosis not present

## 2018-01-21 DIAGNOSIS — R3121 Asymptomatic microscopic hematuria: Secondary | ICD-10-CM | POA: Diagnosis not present

## 2018-01-24 DIAGNOSIS — Z1212 Encounter for screening for malignant neoplasm of rectum: Secondary | ICD-10-CM | POA: Diagnosis not present

## 2018-01-31 DIAGNOSIS — F331 Major depressive disorder, recurrent, moderate: Secondary | ICD-10-CM | POA: Diagnosis not present

## 2018-01-31 DIAGNOSIS — F411 Generalized anxiety disorder: Secondary | ICD-10-CM | POA: Diagnosis not present

## 2018-02-21 DIAGNOSIS — Z1231 Encounter for screening mammogram for malignant neoplasm of breast: Secondary | ICD-10-CM | POA: Diagnosis not present

## 2018-02-21 DIAGNOSIS — Z803 Family history of malignant neoplasm of breast: Secondary | ICD-10-CM | POA: Diagnosis not present

## 2018-02-25 DIAGNOSIS — F411 Generalized anxiety disorder: Secondary | ICD-10-CM | POA: Diagnosis not present

## 2018-02-25 DIAGNOSIS — F331 Major depressive disorder, recurrent, moderate: Secondary | ICD-10-CM | POA: Diagnosis not present

## 2018-03-04 DIAGNOSIS — Z85828 Personal history of other malignant neoplasm of skin: Secondary | ICD-10-CM | POA: Diagnosis not present

## 2018-03-04 DIAGNOSIS — D485 Neoplasm of uncertain behavior of skin: Secondary | ICD-10-CM | POA: Diagnosis not present

## 2018-03-04 DIAGNOSIS — L83 Acanthosis nigricans: Secondary | ICD-10-CM | POA: Diagnosis not present

## 2018-03-06 ENCOUNTER — Emergency Department (HOSPITAL_COMMUNITY)
Admission: EM | Admit: 2018-03-06 | Discharge: 2018-03-06 | Disposition: A | Discharge status: Home / Self Care | Payer: Medicare Other | Attending: Emergency Medicine | Admitting: Emergency Medicine

## 2018-03-06 ENCOUNTER — Other Ambulatory Visit: Payer: Self-pay

## 2018-03-06 ENCOUNTER — Encounter (HOSPITAL_COMMUNITY): Payer: Self-pay

## 2018-03-06 ENCOUNTER — Emergency Department (HOSPITAL_COMMUNITY): Payer: Medicare Other

## 2018-03-06 DIAGNOSIS — Z96651 Presence of right artificial knee joint: Secondary | ICD-10-CM | POA: Diagnosis not present

## 2018-03-06 DIAGNOSIS — W19XXXA Unspecified fall, initial encounter: Secondary | ICD-10-CM | POA: Diagnosis not present

## 2018-03-06 DIAGNOSIS — Y999 Unspecified external cause status: Secondary | ICD-10-CM | POA: Diagnosis not present

## 2018-03-06 DIAGNOSIS — S0083XA Contusion of other part of head, initial encounter: Secondary | ICD-10-CM

## 2018-03-06 DIAGNOSIS — I10 Essential (primary) hypertension: Secondary | ICD-10-CM | POA: Diagnosis not present

## 2018-03-06 DIAGNOSIS — Z7982 Long term (current) use of aspirin: Secondary | ICD-10-CM | POA: Diagnosis not present

## 2018-03-06 DIAGNOSIS — Z79899 Other long term (current) drug therapy: Secondary | ICD-10-CM | POA: Diagnosis not present

## 2018-03-06 DIAGNOSIS — W010XXA Fall on same level from slipping, tripping and stumbling without subsequent striking against object, initial encounter: Secondary | ICD-10-CM | POA: Diagnosis not present

## 2018-03-06 DIAGNOSIS — S43005A Unspecified dislocation of left shoulder joint, initial encounter: Secondary | ICD-10-CM

## 2018-03-06 DIAGNOSIS — Y9389 Activity, other specified: Secondary | ICD-10-CM | POA: Diagnosis not present

## 2018-03-06 DIAGNOSIS — R000 Tachycardia, unspecified: Secondary | ICD-10-CM | POA: Diagnosis not present

## 2018-03-06 DIAGNOSIS — S0990XA Unspecified injury of head, initial encounter: Secondary | ICD-10-CM | POA: Diagnosis not present

## 2018-03-06 DIAGNOSIS — Y92009 Unspecified place in unspecified non-institutional (private) residence as the place of occurrence of the external cause: Secondary | ICD-10-CM | POA: Insufficient documentation

## 2018-03-06 DIAGNOSIS — S0512XA Contusion of eyeball and orbital tissues, left eye, initial encounter: Secondary | ICD-10-CM | POA: Diagnosis not present

## 2018-03-06 DIAGNOSIS — S43015A Anterior dislocation of left humerus, initial encounter: Secondary | ICD-10-CM | POA: Insufficient documentation

## 2018-03-06 DIAGNOSIS — S199XXA Unspecified injury of neck, initial encounter: Secondary | ICD-10-CM | POA: Diagnosis not present

## 2018-03-06 MED ORDER — PROPOFOL 10 MG/ML IV BOLUS
0.5000 mg/kg | Freq: Once | INTRAVENOUS | Status: AC
  Administered 2018-03-06: 50 mg via INTRAVENOUS
  Filled 2018-03-06: qty 20

## 2018-03-06 MED ORDER — FENTANYL CITRATE (PF) 100 MCG/2ML IJ SOLN
50.0000 ug | Freq: Once | INTRAMUSCULAR | Status: AC
  Administered 2018-03-06: 50 ug via INTRAVENOUS
  Filled 2018-03-06: qty 2

## 2018-03-06 NOTE — ED Triage Notes (Addendum)
Patient comes form home. Patient arrived via [facility]. Patient is AOx4 and ambulatory with assistance. Patient was walking through living room, tripped over rug, attempted to brace self with arms and left shoulder is deformed and patient has hematoma below left eye.   Patient is not on blood thinners. Family is in route to facility.  115mcg of Fentanyl given IV.

## 2018-03-06 NOTE — ED Notes (Signed)
Portable Xray at bedside.

## 2018-03-06 NOTE — ED Notes (Signed)
Dr.Rees at bedside with pt at this time. Pt is alert and oriented x 4. No acute distress.

## 2018-03-06 NOTE — ED Notes (Addendum)
Pt ambulatory in the hall without assistant. Steady Gait

## 2018-03-06 NOTE — ED Provider Notes (Signed)
[facility] DEPT Provider Note   CSN: [id number] Arrival date & time: 03/06/18  1632     History   Chief Complaint Chief Complaint  Patient presents with  . Fall  . Left Shoulder Injury  . Hematoma Left Eye    HPI Beth Burke is a 78 y.o. female.  The history is provided by the patient and the EMS personnel. No language interpreter was used.  Fall    Beth Burke is a 78 y.o. female who presents to the Emergency Department complaining of fall. She presents to the emergency department by EMS for evaluation of injuries following a fall that occurred just prior to ED arrival. She was walking in her home when she tripped over a rug and fell forward with her arms outstretched. She reports severe pain to her left shoulder. She also hit her face and has pain to the left side of her face. No loss of consciousness. Denies headache, vision changes, neck pain, chest pain, shortness of breath. She does not take any blood thinners. She lives with family. She is right-hand dominant. She received 100 g of fentanyl prior to ED arrival. Symptoms are severe and constant in nature. Past Medical History:  Diagnosis Date  . Anxiety   . Arthritis   . Blood transfusion    WITH KNEE REPLACEMENT 01/2011  . Elevated cholesterol   . GERD (gastroesophageal reflux disease)   . Heart murmur   . Hypertension   . Scarlet fever as child  . Sleep apnea    mild sleep apnea, no cpap used  . Urinary incontinence    wears pads  . Varicose veins   . Vertigo     Patient Active Problem List   Diagnosis Date Noted  . OSA (obstructive sleep apnea) 12/26/2013  . Snoring 11/06/2013  . 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 Fatigue due to depression 11/06/2013  . Depression 11/06/2013  . Postoperative stiffness of total knee replacement ([place]) 05/22/2011  . Postop Hypokalemia 02/18/2011  . Postop Transfusion history 02/18/2011  . Postop Acute blood loss anemia 02/17/2011  . OA  (osteoarthritis) of knee 02/16/2011    Past Surgical History:  Procedure Laterality Date  . ABDOMINAL HYSTERECTOMY  1984  . CERVICAL FUSION  april 2012   C5 to C7 with plates--PT HAS LIMITED ROM AND PAIN  . KNEE CLOSED REDUCTION  05/22/2011   Procedure: CLOSED MANIPULATION KNEE;  Surgeon: Gearlean Alf, MD;  Location: [facility];  Service: Orthopedics;  Laterality: Right;  . right lower leg surgery  1962   wire placed for crushed leg   . TOTAL KNEE ARTHROPLASTY  02/16/2011   Procedure: TOTAL KNEE ARTHROPLASTY;  Surgeon: Gearlean Alf, MD;  Location: [facility];  Service: Orthopedics;  Laterality: Right;     OB History   No obstetric history on file.      Home Medications    Prior to Admission medications   Medication Sig Start Date End Date Taking? Authorizing Provider  amLODipine (NORVASC) 5 MG tablet Take 5 mg by mouth daily after breakfast.     [provider]  aspirin EC 81 MG tablet Take 81 mg by mouth at bedtime.    [provider]  benazepril (LOTENSIN) 40 MG tablet Take 1 tablet by mouth daily. 08/07/14   [provider]  Calcium Carbonate-Vit D-Min (CALTRATE 600+D PLUS PO) Take 1 tablet by mouth daily.    [provider]  Cholecalciferol (VITAMIN D) 2000 UNITS tablet Take 2,000  Units by mouth daily.    [provider]  Coenzyme Q10 (CO Q-10) 100 MG CAPS Take by mouth.    [provider]  fexofenadine (ALLEGRA) 180 MG tablet Take 180 mg by mouth daily as needed. Allergies     [provider]  fluticasone (FLONASE) 50 MCG/ACT nasal spray Place 2 sprays into the nose daily as needed. Allergies    [provider]  Garlic 4098 MG CAPS Take by mouth.    [provider]  hydroxypropyl methylcellulose (ISOPTO TEARS) 2.5 % ophthalmic solution Place 1 drop into both eyes every evening.     [provider]  ibuprofen (ADVIL,MOTRIN) 200 MG tablet Take 600 mg by mouth every 6 (six) hours as needed.  Pain     [provider]  Multiple Vitamin (MULTI-VITAMIN DAILY PO) Multi Vitamin    [provider]  Multiple Vitamin (MULTIVITAMIN) tablet Take 1 tablet by mouth daily.    [provider]  NON [place]  Anti-Inflammatory Cream- Diclofenac 3%, Baclofen 2%, Lidocaine 2% Apply 1-2 grams to affected area 3-4 times daily Qty. 120 gm 3 refills    [provider]  Omega-3 Fatty Acids (FISH OIL) 1000 MG CAPS Fish Oil    [provider]  simvastatin (ZOCOR) 10 MG tablet Take 10 mg by mouth at bedtime.     [provider]  venlafaxine XR (EFFEXOR-XR) 75 MG 24 hr capsule Take 75 mg by mouth daily. 10/30/13   [provider]    Family History Family History  Problem Relation Age of Onset  . Allergies Mother   . Hypertension Mother   . Gout Mother   . Cancer - Other Brother   . Hypertension Brother   . Asthma Brother   . Heart attack Maternal Aunt     Social History Social History   Tobacco Use  . Smoking status: Never Smoker  . Smokeless tobacco: Never Used  Substance Use Topics  . Alcohol use: No  . Drug use: No     Allergies   Codeine and Oxycodone   Review of Systems Review of Systems  All other systems reviewed and are negative.    Physical Exam Updated Vital Signs BP (!) 149/79 (BP Location: Right Arm)   Pulse (!) 103   Temp 98.9 F (37.2 C) (Oral)   Resp 20   Ht 5\' 2"  (1.575 m)   Wt 54 kg   SpO2 96%   BMI 21.77 kg/m   Physical Exam Vitals signs and nursing note reviewed.  Constitutional:      Appearance: She is well-developed.  HENT:     Head: Normocephalic.     Comments: PERRL.  Left periorbital ecchymosis and hematoma.   Cardiovascular:     Rate and Rhythm: Normal rate and regular rhythm.     Heart sounds: No murmur.  Pulmonary:     Effort: Pulmonary effort is normal. No respiratory distress.     Breath sounds: Normal breath sounds.  Abdominal:     Palpations:  Abdomen is soft.     Tenderness: There is no abdominal tenderness. There is no guarding or rebound.  Musculoskeletal:     Comments: 2+ radial pulses bilaterally.  2+ DP pulses bilaterally.  There is a deformity to the left shoulder with local tenderness to palpation.  No tenderness to elbow, forearm, wrist.   Skin:    General: Skin is warm and dry.  Neurological:     Mental Status: She is  alert and oriented to person, place, and time.     Comments: 5/5 grip strength bilaterally.  Sensation to light touch intact in BUE.    Psychiatric:        Mood and Affect: Mood normal.        Behavior: Behavior normal.      ED Treatments / Results  Labs (all labs ordered are listed, but only abnormal results are displayed) Labs Reviewed - No data to display  EKG None  Radiology No results found.  Procedures .Sedation Date/Time: 03/06/2018 7:04 PM Performed by: Quintella Reichert, MD Authorized by: Quintella Reichert, MD   Consent:    Consent obtained:  Verbal   Consent given by:  Patient   Risks discussed:  Allergic reaction, dysrhythmia, inadequate sedation, nausea, prolonged hypoxia resulting in organ damage, prolonged sedation necessitating reversal, respiratory compromise necessitating ventilatory assistance and intubation and vomiting   Alternatives discussed:  Analgesia without sedation, anxiolysis and regional anesthesia Universal protocol:    Procedure explained and questions answered to patient or proxy's satisfaction: yes     Relevant documents present and verified: yes     Test results available and properly labeled: yes     Imaging studies available: yes     Required blood products, implants, devices, and special equipment available: yes     Site/side marked: yes     Immediately prior to procedure a time out was called: yes     Patient identity confirmation method:  Verbally with patient Indications:    Procedure performed:  Dislocation reduction   Procedure necessitating  sedation performed by:  Physician performing sedation Pre-sedation assessment:    Time since last food or drink:  5 hr   ASA classification: class 2 - patient with mild systemic disease     Neck mobility: normal     Mouth opening:  3 or more finger widths   Thyromental distance:  4 finger widths   Mallampati score:  II - soft palate, uvula, fauces visible   Pre-sedation assessments completed and reviewed: airway patency, cardiovascular function, hydration status, mental status, nausea/vomiting, pain level, respiratory function and temperature   Immediate pre-procedure details:    Reassessment: Patient reassessed immediately prior to procedure     Reviewed: vital signs, relevant labs/tests and NPO status     Verified: bag valve mask available, emergency equipment available, intubation equipment available, IV patency confirmed, oxygen available and suction available   Procedure details (see MAR for exact dosages):    Preoxygenation:  Nasal cannula   Sedation:  Propofol   Intra-procedure monitoring:  Blood pressure monitoring, cardiac monitor, continuous pulse oximetry, frequent LOC assessments, frequent vital sign checks and continuous capnometry   Intra-procedure events: none     Total Provider sedation time (minutes):  10 Post-procedure details:    Post-sedation assessment completed:  03/06/2018 7:07 PM   Attendance: Constant attendance by certified staff until patient recovered     Recovery: Patient returned to pre-procedure baseline     Post-sedation assessments completed and reviewed: airway patency, cardiovascular function, hydration status, mental status, nausea/vomiting, pain level, respiratory function and temperature     Patient is stable for discharge or admission: yes     Patient tolerance:  Tolerated well, no immediate complications  .Ortho Injury Treatment Date/Time: 03/06/2018 7:07 PM Performed by: Quintella Reichert, MD Authorized by: Quintella Reichert, MD   Consent:    Consent  obtained:  Verbal   Consent given by:  Patient   Risks discussed:  Fracture, nerve damage, vascular  damage, irreducible dislocation, recurrent dislocation and stiffness   Alternatives discussed:  No treatmentInjury location: shoulder Location details: left shoulder Injury type: dislocation Chronicity: new Pre-procedure neurovascular assessment: neurovascularly intact Pre-procedure distal perfusion: normal Pre-procedure neurological function: normal  Anesthesia: Local anesthesia used: no  Patient sedated: Yes. Refer to sedation procedure documentation for details of sedation. Manipulation performed: yes Immobilization: sling Post-procedure neurovascular assessment: post-procedure neurovascularly intact    (including critical care time)  Medications Ordered in ED Medications  fentaNYL (SUBLIMAZE) injection 50 mcg (has no administration in time range)     Initial Impression / Assessment and Plan / ED Course  I have reviewed the triage vital signs and the nursing notes.  Pertinent labs & imaging results that were available during my care of the patient were reviewed by me and considered in my medical decision making (see chart for details).    Pt here for evaluation of injuries following a mechanical fall. She has a left shoulder dislocation, treated with reduction in the department. Post reduction she is feeling improved and neurovascular early intact. She is able to ambulate without difficulty. Discussed with patient home care following a fall with facial contusion and shoulder dislocation. Discussed outpatient orthopedics and PCP follow-up as well as return precautions. Final Clinical Impressions(s) / ED Diagnoses   Final diagnoses:  None    ED Discharge Orders    None       Quintella Reichert, MD 03/06/18 2328

## 2018-03-06 NOTE — ED Notes (Signed)
Bed: QN01 Expected date:  Expected time:  Means of arrival:  Comments: EMS fall w/ shoulder deformity

## 2018-03-07 DIAGNOSIS — M25512 Pain in left shoulder: Secondary | ICD-10-CM | POA: Diagnosis not present

## 2018-03-14 ENCOUNTER — Institutional Professional Consult (permissible substitution): Payer: 59 | Admitting: Neurology

## 2018-03-15 DIAGNOSIS — M25512 Pain in left shoulder: Secondary | ICD-10-CM | POA: Diagnosis not present

## 2018-03-23 DIAGNOSIS — M75121 Complete rotator cuff tear or rupture of right shoulder, not specified as traumatic: Secondary | ICD-10-CM | POA: Diagnosis not present

## 2018-03-25 DIAGNOSIS — F411 Generalized anxiety disorder: Secondary | ICD-10-CM | POA: Diagnosis not present

## 2018-03-25 DIAGNOSIS — F331 Major depressive disorder, recurrent, moderate: Secondary | ICD-10-CM | POA: Diagnosis not present

## 2018-04-11 DIAGNOSIS — M25512 Pain in left shoulder: Secondary | ICD-10-CM | POA: Diagnosis not present

## 2018-04-25 DIAGNOSIS — G479 Sleep disorder, unspecified: Secondary | ICD-10-CM | POA: Diagnosis not present

## 2018-04-25 DIAGNOSIS — I8310 Varicose veins of unspecified lower extremity with inflammation: Secondary | ICD-10-CM | POA: Diagnosis not present

## 2018-04-25 DIAGNOSIS — I10 Essential (primary) hypertension: Secondary | ICD-10-CM | POA: Diagnosis not present

## 2018-04-25 DIAGNOSIS — F339 Major depressive disorder, recurrent, unspecified: Secondary | ICD-10-CM | POA: Diagnosis not present

## 2018-04-25 DIAGNOSIS — M810 Age-related osteoporosis without current pathological fracture: Secondary | ICD-10-CM | POA: Diagnosis not present

## 2018-06-06 DIAGNOSIS — M75101 Unspecified rotator cuff tear or rupture of right shoulder, not specified as traumatic: Secondary | ICD-10-CM | POA: Diagnosis not present

## 2018-06-06 DIAGNOSIS — M19011 Primary osteoarthritis, right shoulder: Secondary | ICD-10-CM | POA: Diagnosis not present

## 2018-06-13 DIAGNOSIS — D692 Other nonthrombocytopenic purpura: Secondary | ICD-10-CM | POA: Diagnosis not present

## 2018-06-13 DIAGNOSIS — Z85828 Personal history of other malignant neoplasm of skin: Secondary | ICD-10-CM | POA: Diagnosis not present

## 2018-06-13 DIAGNOSIS — L821 Other seborrheic keratosis: Secondary | ICD-10-CM | POA: Diagnosis not present

## 2018-06-13 DIAGNOSIS — L814 Other melanin hyperpigmentation: Secondary | ICD-10-CM | POA: Diagnosis not present

## 2018-06-13 DIAGNOSIS — D044 Carcinoma in situ of skin of scalp and neck: Secondary | ICD-10-CM | POA: Diagnosis not present

## 2018-06-15 DIAGNOSIS — F331 Major depressive disorder, recurrent, moderate: Secondary | ICD-10-CM | POA: Diagnosis not present

## 2018-06-15 DIAGNOSIS — F411 Generalized anxiety disorder: Secondary | ICD-10-CM | POA: Diagnosis not present

## 2018-07-04 DIAGNOSIS — M25811 Other specified joint disorders, right shoulder: Secondary | ICD-10-CM | POA: Diagnosis not present

## 2018-07-04 DIAGNOSIS — M25512 Pain in left shoulder: Secondary | ICD-10-CM | POA: Diagnosis not present

## 2018-07-04 DIAGNOSIS — M19011 Primary osteoarthritis, right shoulder: Secondary | ICD-10-CM | POA: Diagnosis not present

## 2018-07-11 DIAGNOSIS — M25512 Pain in left shoulder: Secondary | ICD-10-CM | POA: Diagnosis not present

## 2018-07-13 DIAGNOSIS — Z961 Presence of intraocular lens: Secondary | ICD-10-CM | POA: Diagnosis not present

## 2018-07-13 DIAGNOSIS — H04123 Dry eye syndrome of bilateral lacrimal glands: Secondary | ICD-10-CM | POA: Diagnosis not present

## 2018-07-19 DIAGNOSIS — M25512 Pain in left shoulder: Secondary | ICD-10-CM | POA: Diagnosis not present

## 2018-07-26 DIAGNOSIS — M25512 Pain in left shoulder: Secondary | ICD-10-CM | POA: Diagnosis not present

## 2018-08-03 DIAGNOSIS — M25512 Pain in left shoulder: Secondary | ICD-10-CM | POA: Diagnosis not present

## 2018-08-09 DIAGNOSIS — M25512 Pain in left shoulder: Secondary | ICD-10-CM | POA: Diagnosis not present

## 2018-08-15 DIAGNOSIS — M7542 Impingement syndrome of left shoulder: Secondary | ICD-10-CM | POA: Diagnosis not present

## 2018-08-15 DIAGNOSIS — M7541 Impingement syndrome of right shoulder: Secondary | ICD-10-CM | POA: Diagnosis not present

## 2018-09-12 DIAGNOSIS — F331 Major depressive disorder, recurrent, moderate: Secondary | ICD-10-CM | POA: Diagnosis not present

## 2018-09-12 DIAGNOSIS — F411 Generalized anxiety disorder: Secondary | ICD-10-CM | POA: Diagnosis not present

## 2018-10-19 DIAGNOSIS — Z23 Encounter for immunization: Secondary | ICD-10-CM | POA: Diagnosis not present

## 2018-10-20 DIAGNOSIS — M25552 Pain in left hip: Secondary | ICD-10-CM | POA: Diagnosis not present

## 2018-10-27 DIAGNOSIS — M25552 Pain in left hip: Secondary | ICD-10-CM | POA: Diagnosis not present

## 2018-12-19 DIAGNOSIS — D692 Other nonthrombocytopenic purpura: Secondary | ICD-10-CM | POA: Diagnosis not present

## 2018-12-19 DIAGNOSIS — L814 Other melanin hyperpigmentation: Secondary | ICD-10-CM | POA: Diagnosis not present

## 2018-12-19 DIAGNOSIS — L570 Actinic keratosis: Secondary | ICD-10-CM | POA: Diagnosis not present

## 2018-12-19 DIAGNOSIS — L821 Other seborrheic keratosis: Secondary | ICD-10-CM | POA: Diagnosis not present

## 2018-12-19 DIAGNOSIS — L820 Inflamed seborrheic keratosis: Secondary | ICD-10-CM | POA: Diagnosis not present

## 2018-12-19 DIAGNOSIS — D485 Neoplasm of uncertain behavior of skin: Secondary | ICD-10-CM | POA: Diagnosis not present

## 2018-12-19 DIAGNOSIS — Z85828 Personal history of other malignant neoplasm of skin: Secondary | ICD-10-CM | POA: Diagnosis not present

## 2019-01-09 DIAGNOSIS — F331 Major depressive disorder, recurrent, moderate: Secondary | ICD-10-CM | POA: Diagnosis not present

## 2019-01-09 DIAGNOSIS — F411 Generalized anxiety disorder: Secondary | ICD-10-CM | POA: Diagnosis not present

## 2019-02-24 ENCOUNTER — Ambulatory Visit: Payer: Medicare Other

## 2019-02-27 DIAGNOSIS — Z1231 Encounter for screening mammogram for malignant neoplasm of breast: Secondary | ICD-10-CM | POA: Diagnosis not present

## 2019-03-01 DIAGNOSIS — E7849 Other hyperlipidemia: Secondary | ICD-10-CM | POA: Diagnosis not present

## 2019-03-01 DIAGNOSIS — M810 Age-related osteoporosis without current pathological fracture: Secondary | ICD-10-CM | POA: Diagnosis not present

## 2019-03-02 DIAGNOSIS — R82998 Other abnormal findings in urine: Secondary | ICD-10-CM | POA: Diagnosis not present

## 2019-03-02 DIAGNOSIS — Z1212 Encounter for screening for malignant neoplasm of rectum: Secondary | ICD-10-CM | POA: Diagnosis not present

## 2019-03-02 DIAGNOSIS — I10 Essential (primary) hypertension: Secondary | ICD-10-CM | POA: Diagnosis not present

## 2019-03-03 ENCOUNTER — Ambulatory Visit: Payer: Medicare Other | Attending: Internal Medicine

## 2019-03-03 DIAGNOSIS — Z23 Encounter for immunization: Secondary | ICD-10-CM | POA: Insufficient documentation

## 2019-03-03 NOTE — Progress Notes (Signed)
   Covid-19 Vaccination Clinic  Name:  RASHAY BENITES    MRN: [id number] DOB: 07-29-1940  03/03/2019  Ms. Zeno was observed post Covid-19 immunization for 15 minutes without incidence. She was provided with Vaccine Information Sheet and instruction to access the V-Safe system.   Ms. Dorff was instructed to call 911 with any severe reactions post vaccine: Marland Kitchen Difficulty breathing  . Swelling of your face and throat  . A fast heartbeat  . A bad rash all over your body  . Dizziness and weakness    Immunizations Administered    Name Date Dose VIS Date Route   Pfizer COVID-19 Vaccine 03/03/2019  9:30 AM 0.3 mL 01/06/2019 Intramuscular   Manufacturer: [place]   Lot: [id number]   [place]: [id number]

## 2019-03-08 DIAGNOSIS — Z0000 Encounter for general adult medical examination without abnormal findings: Secondary | ICD-10-CM | POA: Diagnosis not present

## 2019-03-08 DIAGNOSIS — M25519 Pain in unspecified shoulder: Secondary | ICD-10-CM | POA: Diagnosis not present

## 2019-03-08 DIAGNOSIS — I10 Essential (primary) hypertension: Secondary | ICD-10-CM | POA: Diagnosis not present

## 2019-03-08 DIAGNOSIS — I8310 Varicose veins of unspecified lower extremity with inflammation: Secondary | ICD-10-CM | POA: Diagnosis not present

## 2019-03-08 DIAGNOSIS — G479 Sleep disorder, unspecified: Secondary | ICD-10-CM | POA: Diagnosis not present

## 2019-03-13 ENCOUNTER — Ambulatory Visit: Payer: Medicare Other

## 2019-03-28 ENCOUNTER — Ambulatory Visit: Payer: Medicare Other | Attending: Internal Medicine

## 2019-03-28 DIAGNOSIS — Z23 Encounter for immunization: Secondary | ICD-10-CM | POA: Insufficient documentation

## 2019-03-28 NOTE — Progress Notes (Signed)
   Covid-19 Vaccination Clinic  Name:  Beth Burke    MRN: [id number] DOB: 11-Apr-1940  03/28/2019  [name] was observed post Covid-19 immunization for 15 minutes without incident. She was provided with Vaccine Information Sheet and instruction to access the V-Safe system.   [name] was instructed to call 911 with any severe reactions post vaccine: Marland Kitchen Difficulty breathing  . Swelling of face and throat  . A fast heartbeat  . A bad rash all over body  . Dizziness and weakness   Immunizations Administered    Name Date Dose VIS Date Route   Pfizer COVID-19 Vaccine 03/28/2019 10:17 AM 0.3 mL 01/06/2019 Intramuscular   Manufacturer: [place]   Lot: [id number]   [place]: [id number]

## 2019-03-29 IMAGING — CR DG CHEST 1V
1 series · 1 of 1 positions shown · non-contrast
Comparison: Chest x-ray 05/20/2011.

CLINICAL DATA: 77-year-old female with history of trauma from a
fall today. Left shoulder deformity.

EXAM:
CHEST  1 VIEW

[x chest ap]
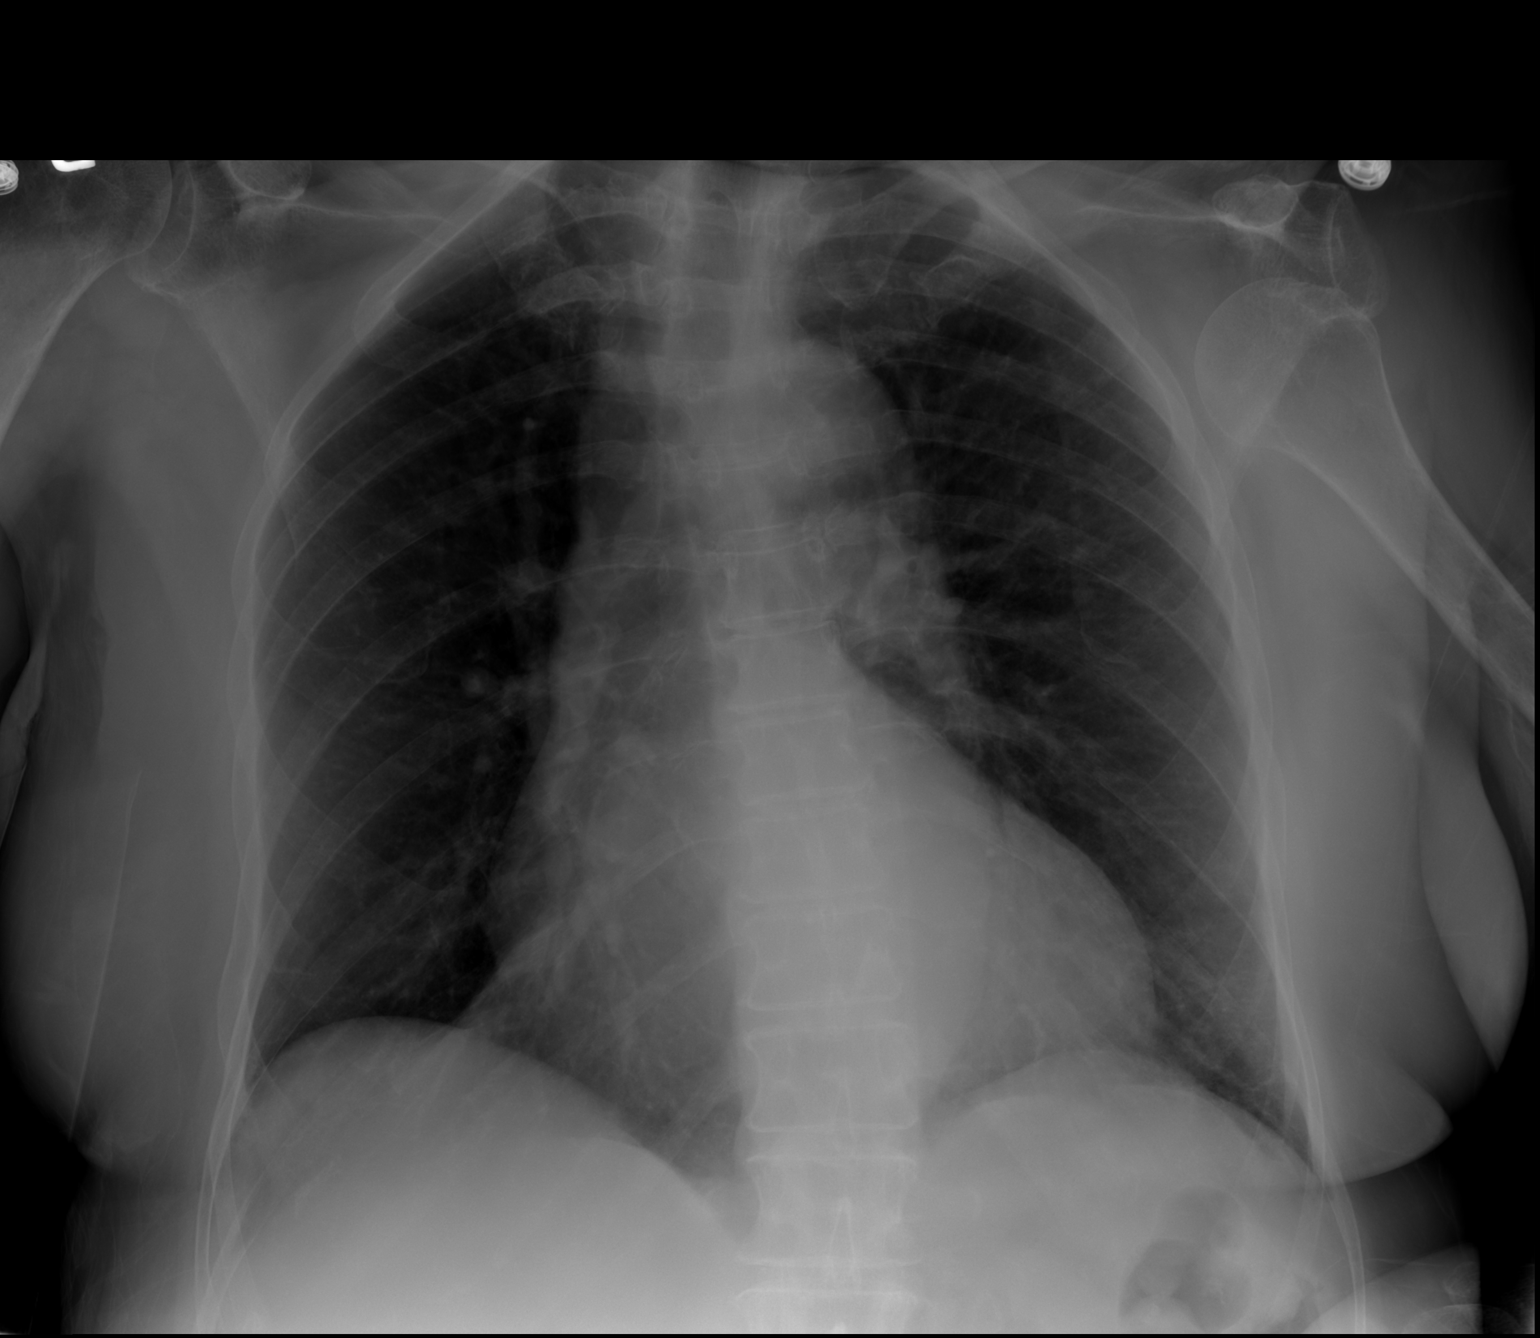

[1 of 1 positions shown; findings below may reference images not displayed]

FINDINGS: Lung volumes are normal. No consolidative airspace disease. No
pleural effusions. No pneumothorax. No pulmonary nodule or mass
noted. Pulmonary vasculature is normal. Mild cardiomegaly. Upper
mediastinal contours are within normal limits.

Left humeral head is inferiorly displaced, likely anteriorly
dislocated. Orthopedic fixation hardware in the lower cervical
spine.
IMPRESSION: 1. No radiographic evidence of acute cardiopulmonary disease.
2. Left shoulder dislocation. Dedicated left shoulder radiographs
are recommended.

## 2019-03-29 IMAGING — DX DG SHOULDER 1V*L*
1 series · 1 of 1 positions shown · non-contrast
Comparison: Film from earlier in the same day.

CLINICAL DATA: Status post reduction of left shoulder dislocation

EXAM:
LEFT SHOULDER - 1 VIEW

[shoulder ap]
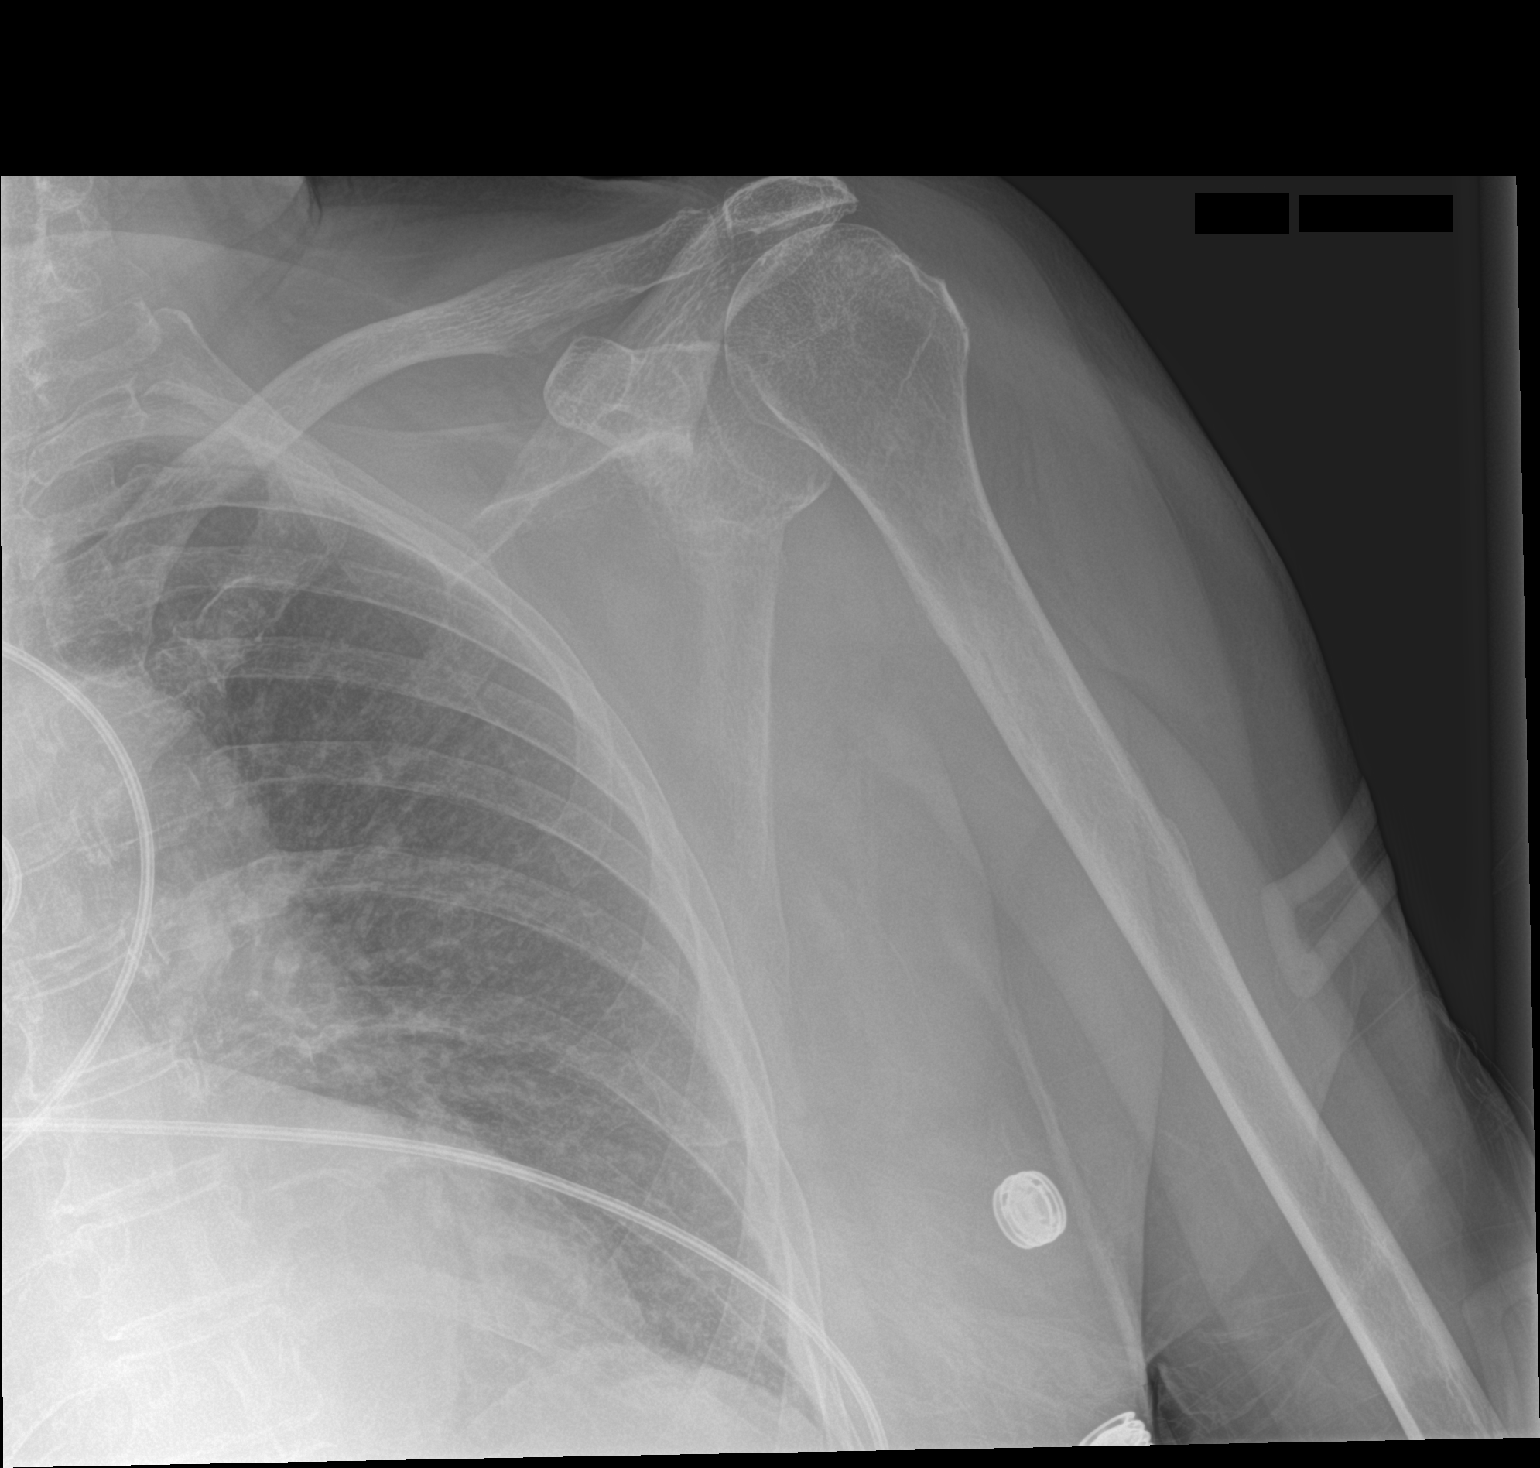

[1 of 1 positions shown; findings below may reference images not displayed]

FINDINGS: Humeral head is been relocated. It is somewhat high-riding adjacent
to the acromion. This may be related to a chronic underlying rotator
cuff in. No definitive fracture is seen. The underlying bony thorax
is within normal limits.
IMPRESSION: Relocation of the left humeral head. It appears somewhat high-riding
as described

## 2019-03-29 IMAGING — CT CT MAXILLOFACIAL W/O CM
4 of 10 series · 15 of 47 positions shown, 17 images · non-contrast
Comparison: None.

CLINICAL DATA: Patient status post fall. Hematoma below the left
eye.

EXAM:
CT HEAD WITHOUT CONTRAST
CT MAXILLOFACIAL WITHOUT CONTRAST
CT CERVICAL SPINE WITHOUT CONTRAST
TECHNIQUE: Multidetector CT imaging of the head, cervical spine, and
maxillofacial structures were performed using the standard protocol
without intravenous contrast. Multiplanar CT image reconstructions
of the cervical spine and maxillofacial structures were also
generated.

[Series 6: coronal soft tissue · coronal · 0.30mm/px · 3 of 67 slices shown]
[im 17/67  bone]
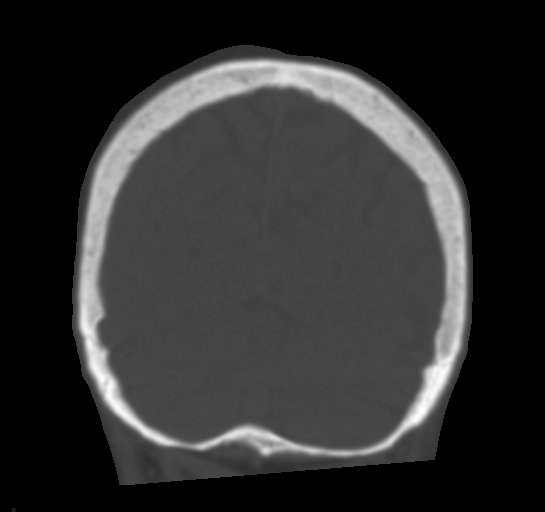
[im 34/67  bone]
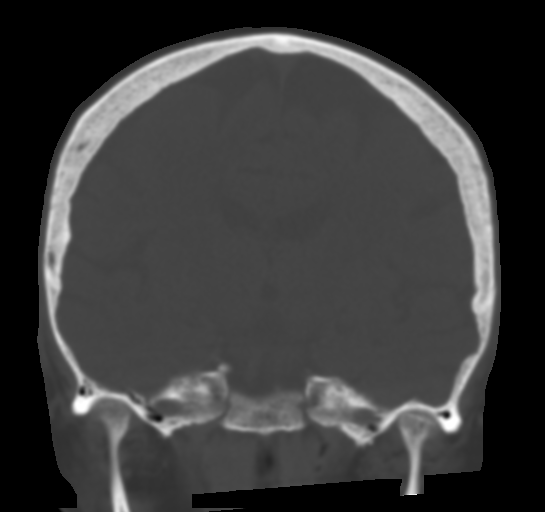
[im 50/67  bone]
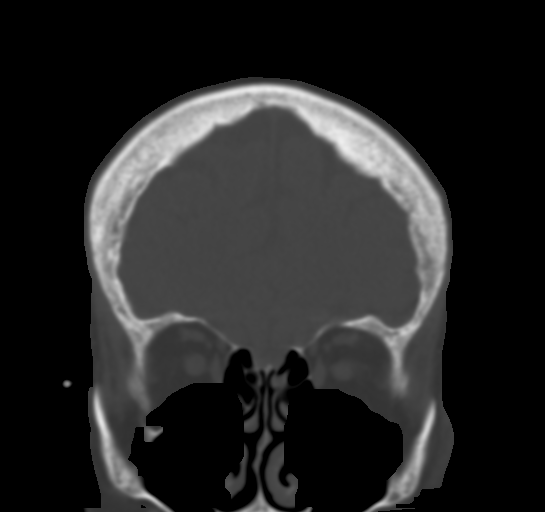

[Series 11: orthogonal axials · axial · 0.28mm/px · z∈[+1142,+1278]mm · 8 of 103 slices shown, 10 images]
[im 12/103  brain]
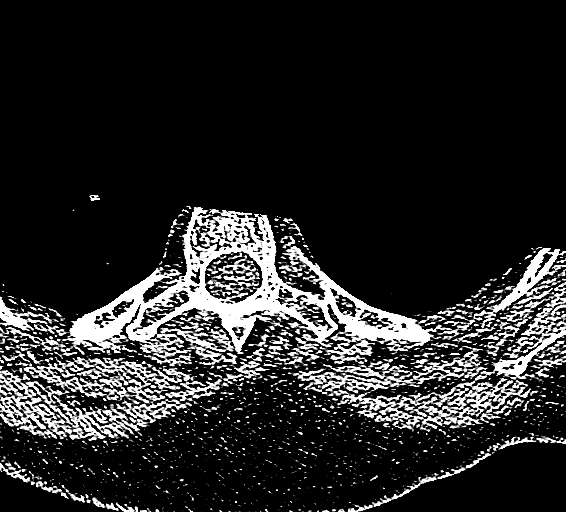
[im 12/103  bone]
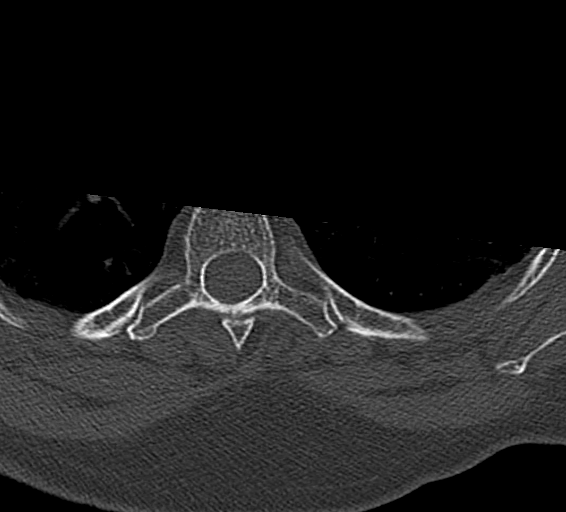
[im 23/103  bone]
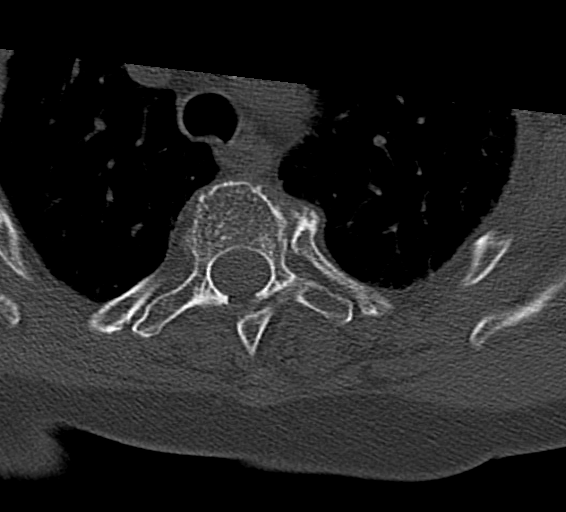
[im 35/103  bone]
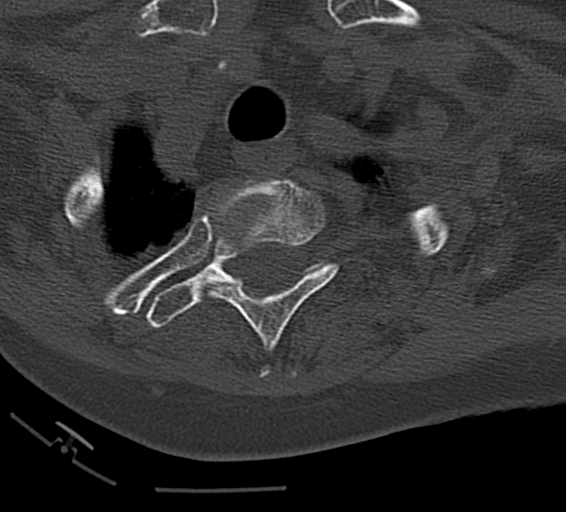
[im 46/103  bone]
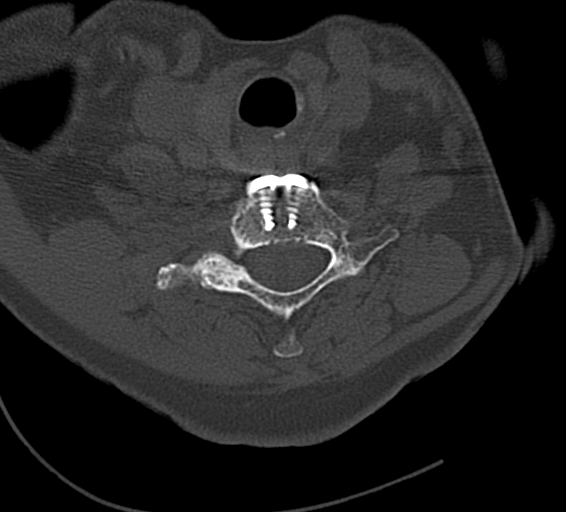
[im 57/103  brain]
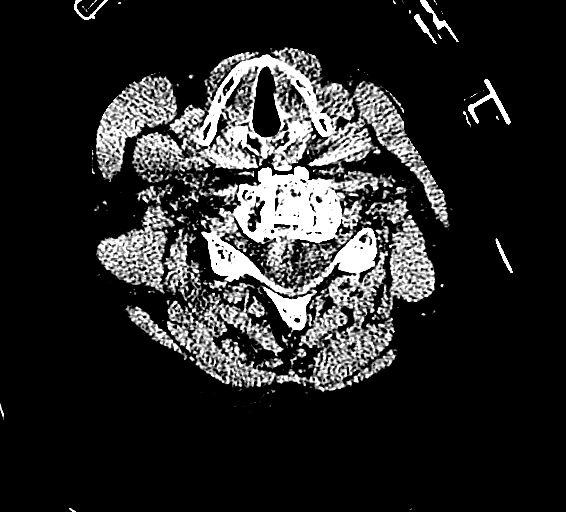
[im 57/103  bone]
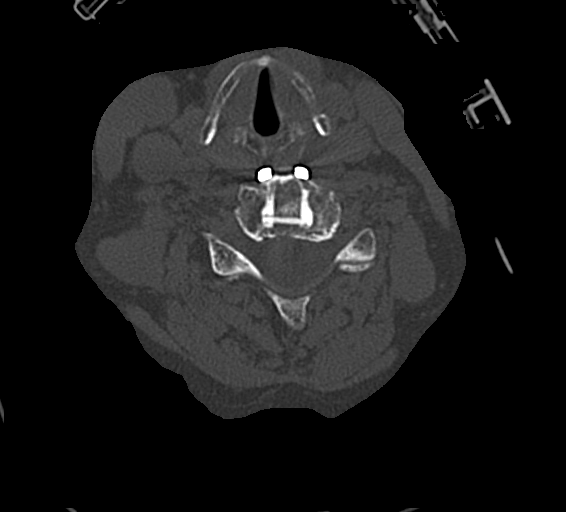
[im 69/103  bone]
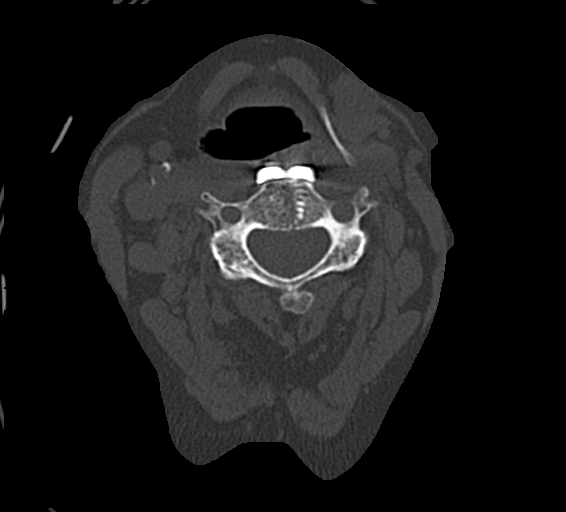
[im 80/103  bone]
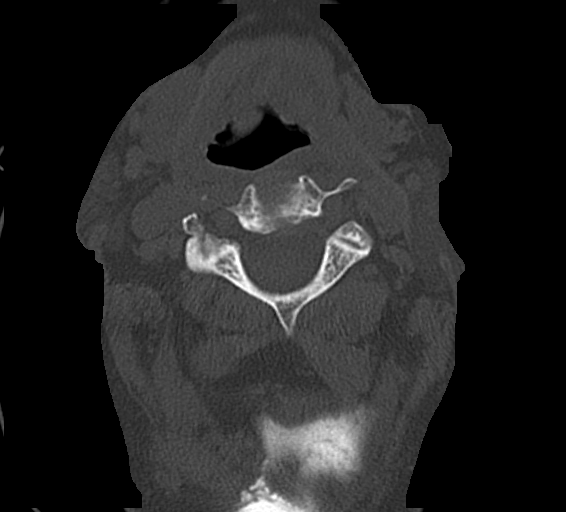
[im 91/103  bone]
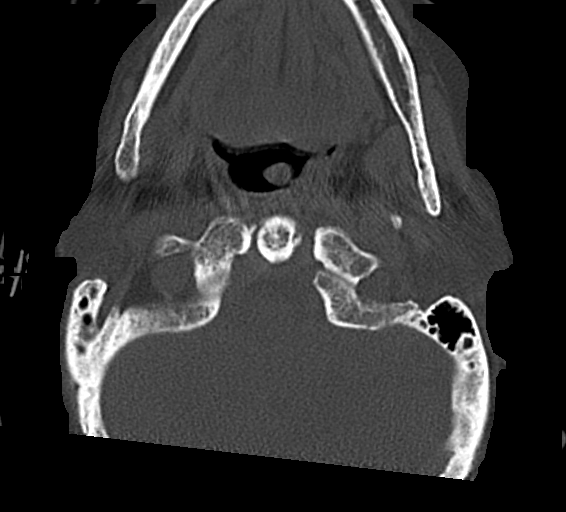

[Series 14: max soft · axial · 0.36mm/px · z∈[+1257,+1301]mm · 3 of 88 slices shown]
[im 11/88  brain]
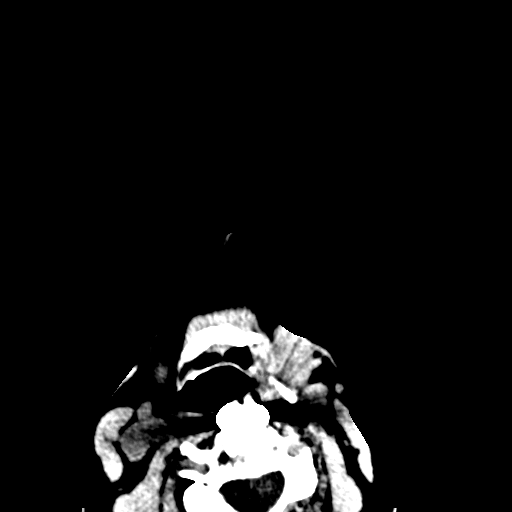
[im 22/88  brain]
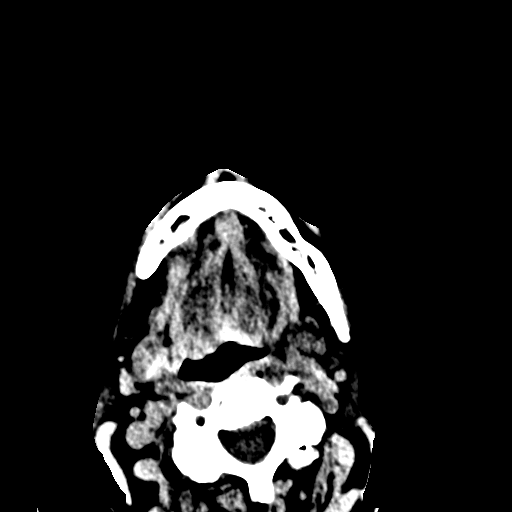
[im 33/88  brain]
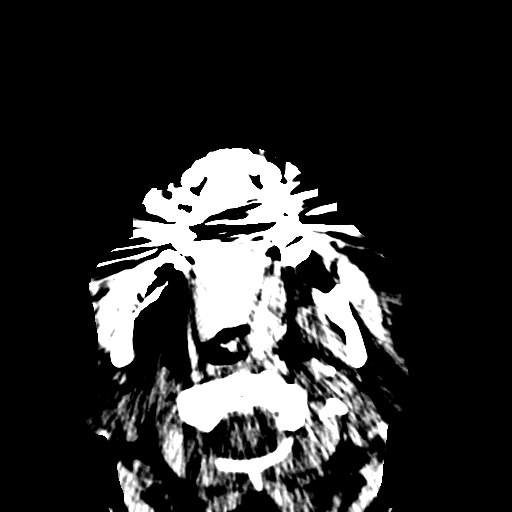

[Series 19: sagittal soft · sagittal · 0.24mm/px · 1 of 104 slices shown]
[im 52/104  bone]
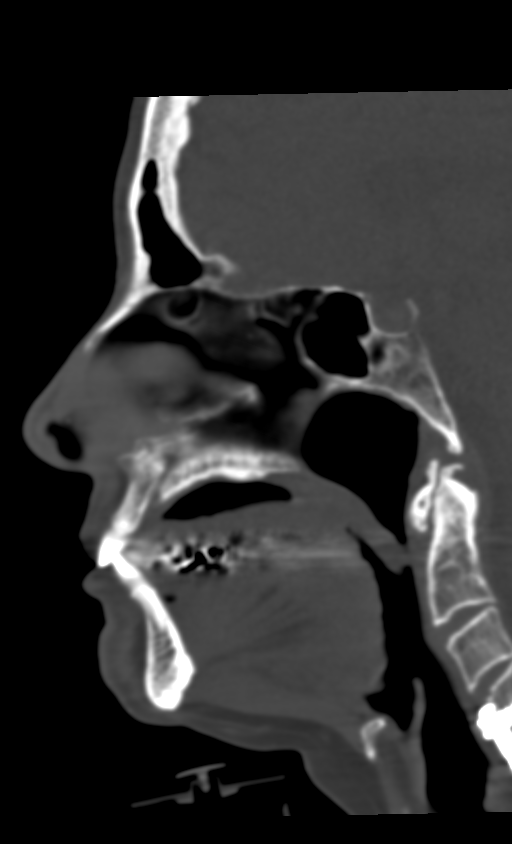

[15 of 47 positions shown; findings below may reference images not displayed]

FINDINGS: CT HEAD FINDINGS

Brain: Ventricles and sulci are prominent compatible with atrophy.
Periventricular and subcortical white matter hypodensity compatible
with chronic microvascular ischemic changes. No evidence for acute
cortically based infarct, intracranial hemorrhage, mass lesion or
mass-effect.

Vascular: Unremarkable

Skull: Intact.

Other: None.

CT MAXILLOFACIAL FINDINGS

Osseous: No acute fracture

Orbits: Orbits are unremarkable. There is soft tissue hematoma
within the left infraorbital soft tissues.

Sinuses: Well aerated. Mild mucosal thickening left ethmoid air
cells. Mastoid air cells unremarkable.

Soft tissues: Soft tissue hematoma within the left infraorbital soft
tissues.

CT CERVICAL SPINE FINDINGS

Alignment: Normal anatomic alignment.

Skull base and vertebrae: No acute fracture. No primary bone lesion
or focal pathologic process.

Soft tissues and spinal canal: No prevertebral fluid or swelling. No
visible canal hematoma.

Disc levels: Patient status post C4-C7 anterior cervical spinal
fusion. Hardware appears intact.

Upper chest: Unremarkable.

Other: None.
IMPRESSION: Left infraorbital soft tissue hematoma.

No acute intracranial process.

No acute maxillofacial fracture.

No acute cervical spine fracture.

## 2019-06-19 DIAGNOSIS — Z85828 Personal history of other malignant neoplasm of skin: Secondary | ICD-10-CM | POA: Diagnosis not present

## 2019-06-19 DIAGNOSIS — L814 Other melanin hyperpigmentation: Secondary | ICD-10-CM | POA: Diagnosis not present

## 2019-06-19 DIAGNOSIS — L570 Actinic keratosis: Secondary | ICD-10-CM | POA: Diagnosis not present

## 2019-06-19 DIAGNOSIS — L820 Inflamed seborrheic keratosis: Secondary | ICD-10-CM | POA: Diagnosis not present

## 2019-06-19 DIAGNOSIS — D225 Melanocytic nevi of trunk: Secondary | ICD-10-CM | POA: Diagnosis not present

## 2019-06-19 DIAGNOSIS — D692 Other nonthrombocytopenic purpura: Secondary | ICD-10-CM | POA: Diagnosis not present

## 2019-06-19 DIAGNOSIS — L821 Other seborrheic keratosis: Secondary | ICD-10-CM | POA: Diagnosis not present

## 2019-08-03 DIAGNOSIS — M79642 Pain in left hand: Secondary | ICD-10-CM | POA: Diagnosis not present

## 2019-08-10 DIAGNOSIS — S62367A Nondisplaced fracture of neck of fifth metacarpal bone, left hand, initial encounter for closed fracture: Secondary | ICD-10-CM | POA: Diagnosis not present

## 2019-08-17 DIAGNOSIS — S62367A Nondisplaced fracture of neck of fifth metacarpal bone, left hand, initial encounter for closed fracture: Secondary | ICD-10-CM | POA: Diagnosis not present

## 2019-08-24 DIAGNOSIS — S62347D Nondisplaced fracture of base of fifth metacarpal bone. left hand, subsequent encounter for fracture with routine healing: Secondary | ICD-10-CM | POA: Diagnosis not present

## 2019-10-06 DIAGNOSIS — M5380 Other specified dorsopathies, site unspecified: Secondary | ICD-10-CM | POA: Diagnosis not present

## 2019-10-06 DIAGNOSIS — M5417 Radiculopathy, lumbosacral region: Secondary | ICD-10-CM | POA: Diagnosis not present

## 2019-11-29 DIAGNOSIS — R509 Fever, unspecified: Secondary | ICD-10-CM | POA: Diagnosis not present

## 2019-11-29 DIAGNOSIS — R059 Cough, unspecified: Secondary | ICD-10-CM | POA: Diagnosis not present

## 2019-11-29 DIAGNOSIS — Z1152 Encounter for screening for COVID-19: Secondary | ICD-10-CM | POA: Diagnosis not present

## 2020-02-09 DIAGNOSIS — M25512 Pain in left shoulder: Secondary | ICD-10-CM | POA: Diagnosis not present

## 2020-02-09 DIAGNOSIS — M25511 Pain in right shoulder: Secondary | ICD-10-CM | POA: Diagnosis not present

## 2020-02-09 DIAGNOSIS — M5412 Radiculopathy, cervical region: Secondary | ICD-10-CM | POA: Diagnosis not present

## 2021-04-24 ENCOUNTER — Other Ambulatory Visit: Payer: Self-pay | Admitting: Internal Medicine

## 2021-04-24 DIAGNOSIS — R0989 Other specified symptoms and signs involving the circulatory and respiratory systems: Secondary | ICD-10-CM

## 2021-05-07 ENCOUNTER — Inpatient Hospital Stay (HOSPITAL_COMMUNITY): Admission: RE | Admit: 2021-05-07 | Payer: Medicare Other | Source: Ambulatory Visit

## 2021-05-09 ENCOUNTER — Ambulatory Visit (HOSPITAL_COMMUNITY)
Admission: RE | Admit: 2021-05-09 | Discharge: 2021-05-09 | Disposition: A | Discharge status: Home / Self Care | Payer: Medicare Other | Source: Ambulatory Visit | Attending: Internal Medicine | Admitting: Internal Medicine

## 2021-05-09 ENCOUNTER — Other Ambulatory Visit (HOSPITAL_COMMUNITY): Payer: Self-pay | Admitting: Internal Medicine

## 2021-05-09 DIAGNOSIS — R0989 Other specified symptoms and signs involving the circulatory and respiratory systems: Secondary | ICD-10-CM

## 2021-11-24 ENCOUNTER — Other Ambulatory Visit (HOSPITAL_COMMUNITY): Payer: Self-pay | Admitting: Family Medicine

## 2021-11-24 ENCOUNTER — Other Ambulatory Visit: Payer: Self-pay | Admitting: Family Medicine

## 2021-11-24 DIAGNOSIS — R42 Dizziness and giddiness: Secondary | ICD-10-CM

## 2021-11-25 ENCOUNTER — Ambulatory Visit (HOSPITAL_COMMUNITY)
Admission: RE | Admit: 2021-11-25 | Discharge: 2021-11-25 | Disposition: A | Discharge status: Home / Self Care | Payer: Medicare Other | Source: Ambulatory Visit | Attending: Family Medicine | Admitting: Family Medicine

## 2021-11-25 DIAGNOSIS — R42 Dizziness and giddiness: Secondary | ICD-10-CM | POA: Diagnosis present

## 2021-11-26 ENCOUNTER — Encounter: Payer: Self-pay | Admitting: Internal Medicine

## 2021-11-26 ENCOUNTER — Ambulatory Visit: Payer: Medicare Other | Admitting: Internal Medicine

## 2021-11-26 VITALS — BP 127/61 | HR 67 | Ht 62.0 in | Wt 128.6 lb

## 2021-11-26 DIAGNOSIS — I10 Essential (primary) hypertension: Secondary | ICD-10-CM | POA: Insufficient documentation

## 2021-11-26 DIAGNOSIS — E782 Mixed hyperlipidemia: Secondary | ICD-10-CM

## 2021-11-26 DIAGNOSIS — R0989 Other specified symptoms and signs involving the circulatory and respiratory systems: Secondary | ICD-10-CM | POA: Insufficient documentation

## 2021-11-26 DIAGNOSIS — R42 Dizziness and giddiness: Secondary | ICD-10-CM

## 2021-11-26 NOTE — Progress Notes (Signed)
Primary Physician/Referring:  Crist Infante, MD  Patient ID: Beth Burke, female    DOB: [birth year], 81 y.o.   MRN: [id number]  Chief Complaint  Patient presents with   New Patient (Initial Visit)   Weakness   Dizziness   HPI:    Beth Burke  is a 81 y.o. female with past medical history significant for hypertension, hyperlipidemia, and dizziness who is here to establish care with cardiology.  Patient has been feeling very dizzy and lightheaded recently.  She did end up going to see her primary care doctor after experiencing dizziness, loss of balance, and generalized weakness.  MRI of the head was ordered which she had at [facility], results are pending.  Patient has never had anything like this in the past.  She was told she in the past that she has a heart murmur however she has not had any work-up for this.  She denies chest pain, shortness of breath, palpitations, diaphoresis, syncope, edema, orthopnea, claudication.  Patient is agreeable to stress test and echocardiogram.  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  Past Medical History:  Diagnosis Date   Anxiety    Arthritis    Blood transfusion    WITH KNEE REPLACEMENT 01/2011   Elevated cholesterol    GERD (gastroesophageal reflux disease)    Heart murmur    Hypertension    Scarlet fever as child   Sleep apnea    mild sleep apnea, no cpap used   Urinary incontinence    wears pads   Varicose veins    Vertigo    Past Surgical History:  Procedure Laterality Date   ABDOMINAL HYSTERECTOMY  1984   CERVICAL FUSION  april 2012   C5 to C7 with plates--PT HAS LIMITED ROM AND PAIN   KNEE CLOSED REDUCTION  05/22/2011   Procedure: CLOSED MANIPULATION KNEE;  Surgeon: Gearlean Alf, MD;  Location: [facility];  Service: Orthopedics;  Laterality: Right;   right lower leg surgery  1962    wire placed for crushed leg    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  Neurological:  Positive for dizziness, light-headedness and weakness.   Objective  Blood pressure 127/61, pulse 67, height '5\' 2"'$  (1.575 m), weight 128 lb 9.6 oz (58.3 kg), SpO2 95 %. Body mass index is 23.52 kg/m.     11/26/2021    8:42 AM 03/06/2018    8:15 PM 03/06/2018    8:00 PM  Vitals with BMI  Height '5\' 2"'$     Weight 128 lbs 10 oz    BMI 95.28    Systolic 413 244 010  Diastolic 61 70 60  Pulse  67 96 100     Physical Exam Vitals reviewed.  HENT:     Head: Normocephalic and atraumatic.  Neck:     Vascular: Carotid bruit present.  Cardiovascular:     Rate and Rhythm: Normal rate and regular rhythm.     Pulses: Normal pulses.     Heart sounds: Murmur heard.  Pulmonary:     Effort: Pulmonary effort is normal.     Breath sounds: Normal breath sounds.  Abdominal:     General: Bowel sounds are normal.  Musculoskeletal:     Right lower leg: No edema.     Left lower leg: No edema.  Skin:    General: Skin is warm and dry.  Neurological:     Mental Status: She is alert.     Medications and allergies   Allergies  Allergen Reactions   Codeine Nausea And Vomiting   Oxycodone Nausea And Vomiting     Medication list after today's encounter   Current Outpatient Medications:    aspirin EC 81 MG tablet, Take 81 mg by  mouth at bedtime., Disp: , Rfl:    benazepril (LOTENSIN) 40 MG tablet, Take 1 tablet by mouth daily., Disp: , Rfl:    Calcium Carbonate-Vit D-Min (CALTRATE 600+D PLUS PO), Take 1 tablet by mouth daily., Disp: , Rfl:    Cholecalciferol (VITAMIN D) 2000 UNITS tablet, Take 2,000 Units by mouth daily., Disp: , Rfl:    Coenzyme Q10 (CO Q-10) 100 MG CAPS, Take by mouth., Disp: , Rfl:    Multiple Vitamin (MULTIVITAMIN) tablet, Take 1 tablet by mouth daily., Disp: , Rfl:    Omega-3 Fatty Acids (FISH OIL) 1000 MG CAPS, Take 1 capsule by mouth daily. , Disp: , Rfl:    sertraline (ZOLOFT) 100 MG tablet, Take 100 mg by mouth daily., Disp: , Rfl:    simvastatin (ZOCOR) 10 MG tablet, Take 10 mg by mouth at bedtime. , Disp: , Rfl:    amLODipine (NORVASC) 5 MG tablet, Take 5 mg by mouth daily after breakfast.  (Patient not taking: Reported on 11/26/2021), Disp: , Rfl:   Laboratory examination:   Lab Results  Component Value Date   NA 140 05/20/2011   K 4.1 05/20/2011   CO2 24 05/20/2011   GLUCOSE 72 05/20/2011   BUN 14 05/20/2011   CREATININE 0.59 05/20/2011   CALCIUM 9.4 05/20/2011   GFRNONAA >90 05/20/2011       Latest Ref Rng & Units 05/20/2011    3:25 PM 02/18/2011    3:42 AM 02/17/2011    3:30 AM  CMP  Glucose 70 - 99 mg/dL 72  104  132   BUN 6 - 23 mg/dL '14  7  8   '$ Creatinine 0.50 - 1.10 mg/dL 0.59  0.64  0.61   Sodium 135 - 145 mEq/L 140  135  136   Potassium 3.5 - 5.1 mEq/L 4.1  3.4  3.6   Chloride 96 - 112 mEq/L 107  102  102   CO2 19 - 32 mEq/L '24  27  25   '$ Calcium 8.4 - 10.5 mg/dL 9.4  8.1  8.3       Latest Ref Rng & Units 05/20/2011    3:25 PM 02/19/2011    3:43 AM 02/18/2011    3:42 AM  CBC  WBC 4.0 - 10.5 K/uL 7.3  9.8  9.7   Hemoglobin 12.0 - 15.0 g/dL 12.2  10.4  8.0   Hematocrit 36.0 - 46.0 % 36.9  29.9  23.8   Platelets 150 - 400 K/uL 208  93  107     Lipid Panel No results for input(s): "CHOL", "TRIG", "LDLCALC", "VLDL", "HDL", "CHOLHDL", "LDLDIRECT" in the last 8760  hours.  HEMOGLOBIN A1C No results found for: "HGBA1C", "MPG" TSH No results for input(s): "TSH" in the last 8760 hours.  External labs:     Radiology:    Cardiac Studies:   05/09/2021 Carotid Duplex Summary:   Right Carotid: Velocities in the right ICA are consistent with a 1-39% stenosis.   Left Carotid: Velocities in the left ICA are consistent with a 40-59% stenosis.   Vertebrals:  Bilateral vertebral arteries demonstrate antegrade flow.  Subclavians: Normal flow hemodynamics were seen in the left subclavian artery. Right subclavain artery flow is monophasic.     EKG:   11/26/2021: Sinus Rhythm. Low voltage in precordial leads. Normal axis, normal R wave progression, no evidence of ischemia  Assessment     ICD-10-CM   1. Dizziness  R42 EKG 12-Lead    PCV ECHOCARDIOGRAM COMPLETE    PCV MYOCARDIAL PERFUSION WO LEXISCAN    PCV CAROTID DUPLEX (BILATERAL)    2. Essential hypertension  I10 PCV ECHOCARDIOGRAM COMPLETE    PCV MYOCARDIAL PERFUSION WO LEXISCAN    PCV CAROTID DUPLEX (BILATERAL)    3. Mixed hyperlipidemia  E78.2 PCV ECHOCARDIOGRAM COMPLETE    PCV MYOCARDIAL PERFUSION WO LEXISCAN    PCV CAROTID DUPLEX (BILATERAL)    4. Left carotid bruit  R09.89 PCV ECHOCARDIOGRAM COMPLETE    PCV MYOCARDIAL PERFUSION WO LEXISCAN    PCV CAROTID DUPLEX (BILATERAL)       Orders Placed This Encounter  Procedures   PCV MYOCARDIAL PERFUSION WO LEXISCAN    Standing Status:   Future    Standing Expiration Date:   01/26/2022   EKG 12-Lead   PCV ECHOCARDIOGRAM COMPLETE    Standing Status:   Future    Standing Expiration Date:   11/27/2022    No orders of the defined types were placed in this encounter.   Medications Discontinued During This Encounter  Medication Reason   mirtazapine (REMERON) 15 MG tablet    hydroxypropyl methylcellulose (ISOPTO TEARS) 2.5 % ophthalmic solution    gabapentin (NEURONTIN) 300 MG capsule      Recommendations:   Beth Burke is a 81  y.o.  female with HTN, HLD, and a carotid bruit   Dizziness Orthostatics checks, negative at today's visit Encourage patient to change positions slowly Recommend increased water intake Limit caffeine intake MRI brain done 11/24/21 pending final results   Essential hypertension Continue current cardiac medications. Encourage low-sodium diet, less than 2000 mg daily. Echo and stress test ordered   Mixed hyperlipidemia Continue statin   Left carotid bruit Will repeat carotid ultrasound  Follow-up in 1-2 months or sooner if needed     Floydene Flock, DO, [facility]  11/26/2021, 10:20 AM Office: [phone] Pager: [phone]

## 2021-12-01 ENCOUNTER — Ambulatory Visit: Payer: Medicare Other

## 2021-12-01 ENCOUNTER — Other Ambulatory Visit: Payer: Self-pay | Admitting: Internal Medicine

## 2021-12-01 ENCOUNTER — Encounter: Payer: Medicare Other | Admitting: Cardiology

## 2021-12-01 DIAGNOSIS — R0989 Other specified symptoms and signs involving the circulatory and respiratory systems: Secondary | ICD-10-CM

## 2021-12-01 DIAGNOSIS — I6522 Occlusion and stenosis of left carotid artery: Secondary | ICD-10-CM

## 2021-12-01 DIAGNOSIS — R42 Dizziness and giddiness: Secondary | ICD-10-CM

## 2021-12-01 DIAGNOSIS — E782 Mixed hyperlipidemia: Secondary | ICD-10-CM

## 2021-12-01 DIAGNOSIS — I10 Essential (primary) hypertension: Secondary | ICD-10-CM

## 2021-12-01 NOTE — Progress Notes (Signed)
Primary Physician/Referring:  Crist Infante, MD  Patient ID: Beth Burke, female    DOB: 1940/06/22, 81 y.o.   MRN: [id number]  No chief complaint on file.  HPI:    Beth Burke  is a 81 y.o. female with past medical history significant for hypertension, hyperlipidemia, and dizziness who is here to obtain an echocardiogram.  After her echo patient felt very lightheaded, weak, and dizzy.  She was unable to go home right away.  Patient was placed in exam room, IV was started and 500 cc fluid bolus was given.  After about 1 hour patient was no longer symptomatic and felt well enough to go home.  Patient instructed to take her blood pressure and keep a log until her next visit.  She is to follow-up sooner if needed.  Past Medical History:  Diagnosis Date   Anxiety    Arthritis    Blood transfusion    WITH KNEE REPLACEMENT 01/2011   Elevated cholesterol    GERD (gastroesophageal reflux disease)    Heart murmur    Hypertension    Scarlet fever as child   Sleep apnea    mild sleep apnea, no cpap used   Urinary incontinence    wears pads   Varicose veins    Vertigo    Past Surgical History:  Procedure Laterality Date   ABDOMINAL HYSTERECTOMY  1984   CERVICAL FUSION  april 2012   C5 to C7 with plates--PT HAS LIMITED ROM AND PAIN   KNEE CLOSED REDUCTION  05/22/2011   Procedure: CLOSED MANIPULATION KNEE;  Surgeon: Gearlean Alf, MD;  Location: [facility];  Service: Orthopedics;  Laterality: Right;   right lower leg surgery  1962   wire placed for crushed leg    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 Negative for dyspnea on exertion.  Neurological:  Positive for dizziness, light-headedness and weakness.   Objective  There were no vitals taken for this visit. There is no height or weight on file to calculate BMI.     12/01/2021   12:22 PM 11/26/2021    8:42 AM 03/06/2018    8:15 PM  Vitals with BMI  Height  '5\' 2"'$    Weight  128 lbs 10 oz   BMI  44.31   Systolic 540 086 761  Diastolic 81 61 70  Pulse 71 67 96     Physical Exam Vitals reviewed.  HENT:     Head: Normocephalic and atraumatic.  Neck:     Vascular: Carotid bruit present.  Cardiovascular:     Rate and Rhythm: Normal rate and regular rhythm.     Pulses: Normal pulses.     Heart sounds: Murmur heard.  Pulmonary:     Effort: Pulmonary effort is normal.     Breath sounds: Normal breath sounds.  Abdominal:     General: Bowel sounds are normal.  Musculoskeletal:     Right lower leg: No edema.     Left lower leg: No edema.  Skin:    General: Skin is warm and dry.  Neurological:     Mental Status: She is alert.     Medications and allergies   Allergies  Allergen Reactions   Codeine Nausea And Vomiting   Oxycodone Nausea And Vomiting     Medication list after today's encounter   Current Outpatient Medications:    amLODipine (NORVASC) 5 MG tablet, Take 5 mg by mouth daily after breakfast.  (Patient not taking: Reported on 11/26/2021), Disp: , Rfl:    aspirin EC 81 MG tablet, Take 81 mg by mouth at bedtime., Disp: , Rfl:    benazepril (LOTENSIN) 40 MG tablet, Take 1 tablet by mouth daily., Disp: , Rfl:    Calcium Carbonate-Vit D-Min (CALTRATE 600+D PLUS PO), Take 1 tablet by mouth daily., Disp: , Rfl:    Cholecalciferol (VITAMIN D) 2000 UNITS tablet, Take 2,000 Units by mouth daily., Disp: , Rfl:    Coenzyme Q10 (CO Q-10) 100 MG CAPS, Take  by mouth., Disp: , Rfl:    Multiple Vitamin (MULTIVITAMIN) tablet, Take 1 tablet by mouth daily., Disp: , Rfl:    Omega-3 Fatty Acids (FISH OIL) 1000 MG CAPS, Take 1 capsule by mouth daily. , Disp: , Rfl:    sertraline (ZOLOFT) 100 MG tablet, Take 100 mg by mouth daily., Disp: , Rfl:    simvastatin (ZOCOR) 10 MG tablet, Take 10 mg by mouth at bedtime. , Disp: , Rfl:   Laboratory examination:   Lab Results  Component Value Date   NA 140 05/20/2011   K 4.1 05/20/2011   CO2 24 05/20/2011   GLUCOSE 72 05/20/2011   BUN 14 05/20/2011   CREATININE 0.59 05/20/2011   CALCIUM 9.4 05/20/2011   GFRNONAA >90 05/20/2011       Latest Ref Rng & Units 05/20/2011    3:25 PM 02/18/2011    3:42 AM 02/17/2011    3:30 AM  CMP  Glucose 70 - 99 mg/dL 72  104  132   BUN 6 - 23 mg/dL '14  7  8   '$ Creatinine 0.50 - 1.10 mg/dL 0.59  0.64  0.61   Sodium 135 - 145 mEq/L 140  135  136   Potassium 3.5 - 5.1 mEq/L 4.1  3.4  3.6   Chloride 96 - 112 mEq/L 107  102  102   CO2 19 - 32 mEq/L '24  27  25   '$ Calcium 8.4 - 10.5 mg/dL 9.4  8.1  8.3       Latest Ref Rng & Units 05/20/2011    3:25 PM 02/19/2011    3:43 AM 02/18/2011    3:42 AM  CBC  WBC 4.0 - 10.5 K/uL 7.3  9.8  9.7   Hemoglobin 12.0 - 15.0 g/dL 12.2  10.4  8.0   Hematocrit 36.0 - 46.0 % 36.9  29.9  23.8   Platelets 150 - 400 K/uL 208  93  107     Lipid Panel No results for input(s): "CHOL", "TRIG", "LDLCALC", "VLDL", "HDL", "CHOLHDL", "LDLDIRECT" in the last 8760 hours.  HEMOGLOBIN A1C No results found for: "HGBA1C", "MPG" TSH No results for input(s): "TSH" in the last 8760 hours.  External labs:     Radiology:    Cardiac Studies:   05/09/2021 Carotid Duplex Summary:   Right Carotid:  Velocities in the right ICA are consistent with a 1-39% stenosis.   Left Carotid: Velocities in the left ICA are consistent with a 40-59% stenosis.   Vertebrals:  Bilateral vertebral arteries demonstrate antegrade flow.  Subclavians: Normal flow  hemodynamics were seen in the left subclavian artery. Right subclavain artery flow is monophasic.     EKG:   11/26/2021: Sinus Rhythm. Low voltage in precordial leads. Normal axis, normal R wave progression, no evidence of ischemia  Assessment     ICD-10-CM   1. Dizziness  R42        No orders of the defined types were placed in this encounter.   No orders of the defined types were placed in this encounter.   There are no discontinued medications.    Recommendations:   Beth Burke is a 81 y.o.  female with HTN, HLD, and a carotid bruit   Dizziness Orthostatics checks, negative at today's visit Encourage patient to change positions slowly Recommend increased water intake Limit caffeine intake MRI brain done 11/24/21 pending final results        Beth Flock, DO, [facility]  12/01/2021, 3:02 PM Office: [phone] Pager: [phone]

## 2021-12-09 ENCOUNTER — Ambulatory Visit: Payer: Medicare Other

## 2021-12-09 DIAGNOSIS — R0989 Other specified symptoms and signs involving the circulatory and respiratory systems: Secondary | ICD-10-CM

## 2021-12-09 DIAGNOSIS — R42 Dizziness and giddiness: Secondary | ICD-10-CM

## 2021-12-09 DIAGNOSIS — E782 Mixed hyperlipidemia: Secondary | ICD-10-CM

## 2021-12-09 DIAGNOSIS — I10 Essential (primary) hypertension: Secondary | ICD-10-CM

## 2021-12-12 ENCOUNTER — Encounter: Payer: Self-pay | Admitting: Physician Assistant

## 2021-12-30 ENCOUNTER — Telehealth: Payer: Self-pay

## 2021-12-30 NOTE — Telephone Encounter (Signed)
Patient calling for test results echo and stress

## 2021-12-31 NOTE — Telephone Encounter (Signed)
She has some plaque in left carotid so we will need to do ultrasound every 6 months. Echo showed normal pumping function

## 2022-01-15 ENCOUNTER — Ambulatory Visit (INDEPENDENT_AMBULATORY_CARE_PROVIDER_SITE_OTHER): Payer: Medicare Other | Admitting: Physician Assistant

## 2022-01-15 ENCOUNTER — Other Ambulatory Visit (INDEPENDENT_AMBULATORY_CARE_PROVIDER_SITE_OTHER): Payer: Medicare Other

## 2022-01-15 ENCOUNTER — Encounter: Payer: Self-pay | Admitting: Physician Assistant

## 2022-01-15 VITALS — BP 112/66 | HR 87 | Ht 62.0 in | Wt 129.5 lb

## 2022-01-15 DIAGNOSIS — M6289 Other specified disorders of muscle: Secondary | ICD-10-CM

## 2022-01-15 DIAGNOSIS — G4733 Obstructive sleep apnea (adult) (pediatric): Secondary | ICD-10-CM

## 2022-01-15 DIAGNOSIS — R42 Dizziness and giddiness: Secondary | ICD-10-CM

## 2022-01-15 DIAGNOSIS — R195 Other fecal abnormalities: Secondary | ICD-10-CM

## 2022-01-15 DIAGNOSIS — R1319 Other dysphagia: Secondary | ICD-10-CM

## 2022-01-15 DIAGNOSIS — D649 Anemia, unspecified: Secondary | ICD-10-CM | POA: Diagnosis not present

## 2022-01-15 DIAGNOSIS — Z8673 Personal history of transient ischemic attack (TIA), and cerebral infarction without residual deficits: Secondary | ICD-10-CM

## 2022-01-15 LAB — COMPREHENSIVE METABOLIC PANEL
ALT: 15 U/L (ref 0–35)
AST: 19 U/L (ref 0–37)
Albumin: 4.3 g/dL (ref 3.5–5.2)
Alkaline Phosphatase: 38 U/L — ABNORMAL LOW (ref 39–117)
BUN: 18 mg/dL (ref 6–23)
CO2: 26 mEq/L (ref 19–32)
Calcium: 9.5 mg/dL (ref 8.4–10.5)
Chloride: 103 mEq/L (ref 96–112)
Creatinine, Ser: 0.83 mg/dL (ref 0.40–1.20)
GFR: 66.13 mL/min (ref >60.00)
Glucose, Bld: 87 mg/dL (ref 70–99)
Potassium: 4.5 mEq/L (ref 3.5–5.1)
Sodium: 138 mEq/L (ref 135–145)
Total Bilirubin: 0.4 mg/dL (ref 0.2–1.2)
Total Protein: 7.7 g/dL (ref 6.0–8.3)

## 2022-01-15 LAB — CBC WITH DIFFERENTIAL/PLATELET
Basophils Absolute: 0.1 10*3/uL (ref 0.0–0.1)
Basophils Relative: 0.7 % (ref 0.0–3.0)
Eosinophils Absolute: 0.1 10*3/uL (ref 0.0–0.7)
Eosinophils Relative: 1.4 % (ref 0.0–5.0)
HCT: 35.8 % — ABNORMAL LOW (ref 36.0–46.0)
Hemoglobin: 12 g/dL (ref 12.0–15.0)
Lymphocytes Relative: 12.6 % (ref 12.0–46.0)
Lymphs Abs: 0.9 10*3/uL (ref 0.7–4.0)
MCHC: 33.5 g/dL (ref 30.0–36.0)
MCV: 90.5 fl (ref 78.0–100.0)
Monocytes Absolute: 1.1 10*3/uL — ABNORMAL HIGH (ref 0.1–1.0)
Monocytes Relative: 15.1 % — ABNORMAL HIGH (ref 3.0–12.0)
Neutro Abs: 5.2 10*3/uL (ref 1.4–7.7)
Neutrophils Relative %: 70.2 % (ref 43.0–77.0)
Platelets: 159 10*3/uL (ref 150.0–400.0)
RBC: 3.96 Mil/uL (ref 3.87–5.11)
RDW: 14.5 % (ref 11.5–15.5)
WBC: 7.3 10*3/uL (ref 4.0–10.5)

## 2022-01-15 LAB — IBC + FERRITIN
Ferritin: 35.9 ng/mL (ref 10.0–291.0)
Iron: 26 ug/dL — ABNORMAL LOW (ref 42–145)
Saturation Ratios: 6.1 % — ABNORMAL LOW (ref 20.0–50.0)
TIBC: 428.4 ug/dL (ref 250.0–450.0)
Transferrin: 306 mg/dL (ref 212.0–360.0)

## 2022-01-15 NOTE — Patient Instructions (Signed)
Your provider has requested that you go to the basement level for lab work before leaving today. Press "B" on the elevator. The lab is located at the first door on the left as you exit the elevator.  Here some information about pelvic floor dysfunction. This may be contributing to some of your symptoms. We will continue with our evaluation but I do want you to consider adding on fiber supplement with low-dose MiraLAX daily. Check with GYN about the pesserary.   Follow up with cardiology Suggest repeating sleep study   Pelvic Floor Dysfunction, Female Pelvic floor dysfunction (PFD) is a condition that results when the group of muscles and connective tissues that support the organs in the pelvis (pelvic floor muscles) do not work well. These muscles and their connections form a sling that supports the colon and bladder. In women, they also support the uterus. PFD causes pelvic floor muscles to be too weak, too tight, or both. In PFD, muscle movements are not coordinated. This may cause bowel or bladder problems. It may also cause pain. What are the causes? This condition may be caused by an injury to the pelvic area or by a weakening of pelvic muscles. This often results from pregnancy and childbirth or other types of strain. In many cases, the exact cause is not known. What increases the risk? The following factors may make you more likely to develop this condition: Having chronic bladder tissue inflammation (interstitial cystitis). Being an older person. Being overweight. History of radiation treatment for cancer in the pelvic region. Previous pelvic surgery, such as removal of the uterus (hysterectomy). What are the signs or symptoms? Symptoms of this condition vary and may include: Bladder symptoms, such as: Trouble starting urination and emptying the bladder. Frequent urinary tract infections. Leaking urine when coughing, laughing, or exercising (stress incontinence). Having to pass  urine urgently or frequently. Pain when passing urine. Bowel symptoms, such as: Constipation. Urgent or frequent bowel movements. Incomplete bowel movements. Painful bowel movements. Leaking stool or gas. Unexplained genital or rectal pain. Genital or rectal muscle spasms. Low back pain. Other symptoms may include: A heavy, full, or aching feeling in the vagina. A bulge that protrudes into the vagina. Pain during or after sex. How is this diagnosed? This condition may be diagnosed based on: Your symptoms and medical history. A physical exam. During the exam, your health care provider may check your pelvic muscles for tightness, spasm, pain, or weakness. This may include a rectal exam and a pelvic exam. In some cases, you may have diagnostic tests, such as: Electrical muscle function tests. Urine flow testing. X-ray tests of bowel function. Ultrasound of the pelvic organs. How is this treated? Treatment for this condition depends on the symptoms. Treatment options include: Physical therapy. This may include Kegel exercises to help relax or strengthen the pelvic floor muscles. Biofeedback. This type of therapy provides feedback on how tight your pelvic floor muscles are so that you can learn to control them. Internal or external massage therapy. A treatment that involves electrical stimulation of the pelvic floor muscles to help control pain (transcutaneous electrical nerve stimulation, or TENS). Sound wave therapy (ultrasound) to reduce muscle spasms. Medicines, such as: Muscle relaxants. Bladder control medicines. Surgery to reconstruct or support pelvic floor muscles may be an option if other treatments do not help. Follow these instructions at home: Activity Do your usual activities as told by your health care provider. Ask your health care provider if you should modify any activities. Do  pelvic floor strengthening or relaxing exercises at home as told by your physical  therapist. Lifestyle Maintain a healthy weight. Eat foods that are high in fiber, such as beans, whole grains, and fresh fruits and vegetables. Limit foods that are high in fat and processed sugars, such as fried or sweet foods. Manage stress with relaxation techniques such as yoga or meditation. General instructions If you have problems with leakage: Use absorbable pads or wear padded underwear. Wash frequently with mild soap. Keep your genital and anal area as clean and dry as possible. Ask your health care provider if you should try a barrier cream to prevent skin irritation. Take warm baths to relieve pelvic muscle tension or spasms. Take over-the-counter and prescription medicines only as told by your health care provider. Keep all follow-up visits. How is this prevented? The cause of PFD is not always known, but there are a few things you can do to reduce the risk of developing this condition, including: Staying at a healthy weight. Getting regular exercise. Managing stress. Contact a health care provider if: Your symptoms are not improving with home care. You have signs or symptoms of PFD that get worse at home. You develop new signs or symptoms. You have signs of a urinary tract infection, such as: Fever. Chills. Increased urinary frequency. A burning feeling when urinating. You have not had a bowel movement in 3 days (constipation). Summary Pelvic floor dysfunction results when the muscles and connective tissues in your pelvic floor do not work well. These muscles and their connections form a sling that supports your colon and bladder. In women, they also support the uterus. PFD may be caused by an injury to the pelvic area or by a weakening of pelvic muscles. PFD causes pelvic floor muscles to be too weak, too tight, or a combination of both. Symptoms may vary from person to person. In most cases, PFD can be treated with physical therapies and medicines. Surgery may be an  option if other treatments do not help. This information is not intended to replace advice given to you by your health care provider. Make sure you discuss any questions you have with your health care provider. Document Revised: 05/22/2020 Document Reviewed: 05/22/2020 Elsevier Patient Education  [employer].

## 2022-01-15 NOTE — Progress Notes (Addendum)
01/15/2022 Beth Burke [id number] [birth year]  Referring provider: Geoffery Lyons, NP Primary GI doctor: Dr. Silverio Decamp ( previous Dr. Collene Mares)  ASSESSMENT AND PLAN:   Anemia with positive fecal occult blood 2 out of 3, previous colonoscopy 2014 with Dr. Collene Mares unremarkable will request records. Previous unremarkable colonoscopies prior. Has been having dizziness, fatigue found to have hemoglobin 10.7, I do not have complete records, unable to see MCV or iron ferritin.  Patient states she did not take an iron or ferritin and recheck 2 weeks later was 12.7.  Which is her baseline Patient currently getting worked up by cardiology for dizziness, echocardiogram moderate regurgitation, stress test high risk study has follow-up 01/09.  Unknown if 10.7 was lab error or not, will repeat CBC and iron indices today. If patient does have iron deficiency anemia would suggest colon and endoscopy after she is cleared from cardiology, would either suggest oral iron versus IV iron depending on severity and how she tolerates. If patient does not have an iron deficiency anemia, did have 2 out of 3 positive Hemoccult bloods can consider endoscopy and colonoscopy after cardiac clearance. Will set up with Dr. Silverio Decamp for EGD/Colon and get cardiac clearance for [facility].   Dizziness with history of CVA, OSA. Carotid duplex with left-sided 50 to 70% plaque Echocardiogram ejection fraction 55 to 60% with moderate AR, TR, MR no AS.  And pulmonary hypertension. Stress test high risk study has appointment with cardiology 01/09 Suggest another sleep study Send for cardiac clearance.   Esophageal dysphagia No GERD, just with pills only, previous cervical neck fusion. Consider barium swallow if EGD negative.  Pelvic floor dysfunction Has known large cystocele. Likely contributing to Bm's some Add fiber, follow up GYN  Patient Care Team: Crist Infante, MD as PCP - General (Internal  Medicine)  HISTORY OF PRESENT ILLNESS: 81 y.o. female with a past medical history of HTN, chol, GERD, OSA not on CPAP, and others listed below presents for evaluation of anemia and hemoccult positive.  S/p cervical fusion 04/2010 S/p hysterectomy  MRI at  showed multiple old infarcts of basal ganglia and chronic small vessel.  Carotid US with significant disease left carotid. 50-70%, right is minimal Following [facility] for recent dizziness, seen 11/26/21   She previously saw Dr. Collene Mares, had colonoscopy 03/11/2012, recall was 10 years. Had 2-3 prior that were all normal.  No family history of colon cancer. Her husband passed with colon cancer.  She states she has hemorrhoids and a large cystocele.  She states if she has loose bowels she will have small volume BRB, otherwise denies melena or hematochezia.  She and FOBT + 2/3 with her PCP.  I can see where her HGB was 10.7, baseline 12. I do not see an MCV, I do not see an iron/ferritin.  Patient denies taking an iron and repeat HGB was 12.7.  She continues to be tired all the time, she wakes up tired.  Previous OSA 2015.  She denies SOB, CP. No worse fatigue with walking. She does not exercise. She still has dizziness at times.   She has GERD occ but rare. She does have dysphagia with just large pills for the past year, no issues with food or liquids. Had cervical neck fusion 2012.  Denies nausea, vomiting.  No AB pain.  Has BM daily with magnesium, very rare loose stools with food.  Echo showed EF 55-60%, mod AR, mod MR, mild PHTN, mild TR. Stress test showed  high risk study- has follow up 02/03/22.  She is is still on bASA, no other blood thinner.  She is on b12 sublingual.   Stress test Exercise nuclear stress test was performed using Bruce protocol. Patient reached 4.6 METS, and 92% of age predicted maximum heart rate. Exercise capacity was low. No chest pain reported. Heart rate and hemodynamic response  were normal. Stress EKG revealed no ischemic changes. Normal myocardial perfusion. Stress LVEF 29%. High risk study due to low rest and stress LVEF. Recommend correlation with echcoardiogram.  1. Normal LV systolic function with visual EF 55-60%. Left ventricle cavity is normal in size. Normal left ventricular wall thickness. Normal global wall motion. Doppler evidence of grade II (pseudonormal) diastolic dysfunction, elevated LAP. Calculated EF 57%. 2. Left atrial cavity is moderately dilated. 3. Trileaflet aortic valve. Trace aortic stenosis. Mild to moderate aortic regurgitation. Mild aortic valve leaflet thickening with mild calcification. 4. Structurally normal mitral valve. Moderate (Grade II) mitral regurgitation. 5. Structurally normal tricuspid valve. Mild to moderate tricuspid regurgitation. Mild pulmonary hypertension. RVSP measures 34 mmHg. 6. Structurally normal pulmonic valve. Mild pulmonic regurgitation. 7. No prior available for comparison.  She  reports that she has never smoked. She has never used smokeless tobacco. She reports that she does not drink alcohol and does not use drugs.  Current Medications:    Current Outpatient Medications (Cardiovascular):    benazepril (LOTENSIN) 40 MG tablet, Take 1 tablet by mouth daily.   simvastatin (ZOCOR) 10 MG tablet, Take 10 mg by mouth at bedtime.    amLODipine (NORVASC) 5 MG tablet, Take 5 mg by mouth daily after breakfast.  (Patient not taking: Reported on 11/26/2021)   Current Outpatient Medications (Analgesics):    aspirin EC 81 MG tablet, Take 81 mg by mouth at bedtime.   Current Outpatient Medications (Other):    Calcium Carbonate-Vit D-Min (CALTRATE 600+D PLUS PO), Take 1 tablet by mouth daily.   Cholecalciferol (VITAMIN D) 2000 UNITS tablet, Take 2,000 Units by mouth daily.   Coenzyme Q10 (CO Q-10) 100 MG CAPS, Take by mouth.   Multiple Vitamin (MULTIVITAMIN) tablet, Take 1 tablet by mouth daily.   Omega-3  Fatty Acids (FISH OIL) 1000 MG CAPS, Take 1 capsule by mouth daily.    sertraline (ZOLOFT) 100 MG tablet, Take 100 mg by mouth daily.  Medical History:  Past Medical History:  Diagnosis Date   Anxiety    Arthritis    Blood transfusion    WITH KNEE REPLACEMENT 01/2011   Elevated cholesterol    GERD (gastroesophageal reflux disease)    Heart murmur    Hypertension    Scarlet fever as child   Sleep apnea    mild sleep apnea, no cpap used   Urinary incontinence    wears pads   Varicose veins    Vertigo    Allergies:  Allergies  Allergen Reactions   Codeine Nausea And Vomiting   Oxycodone Nausea And Vomiting     Surgical History:  She  has a past surgical history that includes Cervical fusion (april 2012); right lower leg surgery (1962); Abdominal hysterectomy (1984); Total knee arthroplasty (02/16/2011); and Knee Closed Reduction (05/22/2011). Family History:  Her family history includes Allergies in her mother; Asthma in her brother; Cancer - Other in her brother; Gout in her mother; Heart attack in her maternal aunt; Hypertension in her brother and mother.  REVIEW OF SYSTEMS  : All other systems reviewed and negative except where noted in the History of Present  Illness.  PHYSICAL EXAM: BP 112/66   Pulse 87   Ht '5\' 2"'$  (1.575 m)   Wt 129 lb 8 oz (58.7 kg)   BMI 23.69 kg/m  General:   Pleasant, well developed female in no acute distress Head:   Normocephalic and atraumatic. Eyes:  sclerae anicteric,conjunctive pink  Heart:   regular rate and rhythm, occ PVC, holosystolic murmur Pulm:  Clear anteriorly; no wheezing Abdomen:   Soft, Obese AB, Active bowel sounds. No tenderness . Without guarding and Without rebound, No organomegaly appreciated. Rectal: Not evaluated Extremities:  Without edema. Msk: Symmetrical without gross deformities. Peripheral pulses intact.  Neurologic:  Alert and  oriented x4;  No focal deficits.  Skin:   Dry and intact without significant lesions or  rashes. Psychiatric:  Cooperative. Normal mood and affect.  RELEVANT LABS AND IMAGING: CBC    Component Value Date/Time   WBC 7.3 05/20/2011 1525   RBC 4.03 05/20/2011 1525   HGB 12.2 05/20/2011 1525   HCT 36.9 05/20/2011 1525   PLT 208 05/20/2011 1525   MCV 91.6 05/20/2011 1525   MCH 30.3 05/20/2011 1525   MCHC 33.1 05/20/2011 1525   RDW 14.1 05/20/2011 1525    CMP     Component Value Date/Time   NA 140 05/20/2011 1525   K 4.1 05/20/2011 1525   CL 107 05/20/2011 1525   CO2 24 05/20/2011 1525   GLUCOSE 72 05/20/2011 1525   BUN 14 05/20/2011 1525   CREATININE 0.59 05/20/2011 1525   CALCIUM 9.4 05/20/2011 1525   PROT 7.3 02/09/2011 1505   ALBUMIN 3.8 02/09/2011 1505   AST 17 02/09/2011 1505   ALT 14 02/09/2011 1505   ALKPHOS 45 02/09/2011 1505   BILITOT 0.2 (L) 02/09/2011 1505   GFRNONAA >90 05/20/2011 1525   GFRAA >90 05/20/2011 [place] Lonzo Saulter, PA-C 12:28 PM

## 2022-01-16 ENCOUNTER — Other Ambulatory Visit: Payer: Self-pay

## 2022-01-16 DIAGNOSIS — R1319 Other dysphagia: Secondary | ICD-10-CM

## 2022-01-16 DIAGNOSIS — R195 Other fecal abnormalities: Secondary | ICD-10-CM

## 2022-01-16 DIAGNOSIS — D649 Anemia, unspecified: Secondary | ICD-10-CM

## 2022-01-22 ENCOUNTER — Telehealth: Payer: Self-pay

## 2022-01-22 NOTE — Telephone Encounter (Signed)
Ok to proceed. Hold ASA 3-5 days before and restart 3 days after

## 2022-01-22 NOTE — Telephone Encounter (Signed)
[facility] Pre-operative Risk Assessment     Request for surgical clearance:     Endoscopy Procedure  What type of surgery is being performed? EGD and colonoscopy  When is this surgery scheduled?     02/24/22  What type of clearance is required ?   Medical  Are there any medications that need to be held prior to surgery and how long? No  Practice name and name of physician performing surgery?  Dr. [facility]  What is your office phone and fax number?      Phone- [phone]  [phone]  Anesthesia type (None, local, MAC, general) ?       MAC

## 2022-01-28 NOTE — Telephone Encounter (Signed)
Attempted to call patient to inform her that she is cleared by her cardiologist for upcoming procedure scheduled on 02/24/22. Per Dr. Silverio Decamp patient does not have to hold aspirin 81 mg. She can continue taking aspirin 81 mg daily prior to the procedure.

## 2022-01-28 NOTE — Telephone Encounter (Signed)
Patient does not have to hold aspirin 81 mg.  She can continue taking aspirin 81 mg daily prior to the procedure.  Thanks

## 2022-01-29 NOTE — Telephone Encounter (Signed)
Attempted to reach the patient. Phone kept ringing. Unable to leave VM.

## 2022-01-29 NOTE — Telephone Encounter (Signed)
Advised patient of information below and she understood transferred her over to [employer] for prep instructions.

## 2022-01-30 NOTE — Telephone Encounter (Signed)
Prep instruction mailed to the patient's home address. Advised patient to come to our office to go over instruction. Patient refused. Stated she will read prep instruction herself first at home once she receives by mail and call us with any questions. Stated she will come to the office only if she has difficulty understanding to follow the instruction. There were no other questions or concerns at the end of the call.

## 2022-02-03 ENCOUNTER — Encounter: Payer: Self-pay | Admitting: Internal Medicine

## 2022-02-03 ENCOUNTER — Ambulatory Visit: Payer: Medicare Other | Admitting: Internal Medicine

## 2022-02-03 VITALS — BP 126/61 | HR 66 | Ht 62.0 in | Wt 128.4 lb

## 2022-02-03 DIAGNOSIS — R0989 Other specified symptoms and signs involving the circulatory and respiratory systems: Secondary | ICD-10-CM

## 2022-02-03 DIAGNOSIS — E782 Mixed hyperlipidemia: Secondary | ICD-10-CM

## 2022-02-03 DIAGNOSIS — I10 Essential (primary) hypertension: Secondary | ICD-10-CM

## 2022-02-03 MED ORDER — ROSUVASTATIN CALCIUM 10 MG PO TABS: 10.0000 mg | ORAL_TABLET | Freq: Every day | ORAL | 3 refills | Status: DC

## 2022-02-03 NOTE — Progress Notes (Signed)
Primary Physician/Referring:  Crist Infante, MD  Patient ID: Beth Burke, female    DOB: 02/15/1940, 82 y.o.   MRN: [id number]  Chief Complaint  Patient presents with   Hypertension   Dizziness   Follow-up    Results   HPI:    Beth Burke  is a 82 y.o. female with past medical history significant for hypertension, hyperlipidemia, and dizziness who is here for a follow-up visit. She has been doing well since the last time she was here. No new concerns or complaints. She has not been having much dizziness anymore.  She denies chest pain, shortness of breath, palpitations, diaphoresis, syncope, edema, orthopnea, claudication.    Past Medical History:  Diagnosis Date   Anxiety    Arthritis    Blood transfusion    WITH KNEE REPLACEMENT 01/2011   Elevated cholesterol    GERD (gastroesophageal reflux disease)    Heart murmur    Hypertension    Scarlet fever as child   Sleep apnea    mild sleep apnea, no cpap used   Urinary incontinence    wears pads   Varicose veins    Vertigo    Past Surgical History:  Procedure Laterality Date   ABDOMINAL HYSTERECTOMY  1984   CERVICAL FUSION  april 2012   C5 to C7 with plates--PT HAS LIMITED ROM AND PAIN   KNEE CLOSED REDUCTION  05/22/2011   Procedure: CLOSED MANIPULATION KNEE;  Surgeon: Gearlean Alf, MD;  Location: [facility];  Service: Orthopedics;  Laterality: Right;   right lower leg surgery  1962   wire placed for crushed leg    TOTAL KNEE ARTHROPLASTY  02/16/2011   Procedure: TOTAL KNEE ARTHROPLASTY;  Surgeon: Gearlean Alf, MD;  Location: [facility];  Service: Orthopedics;  Laterality: Right;   Family History  Problem Relation Age of Onset   Allergies Mother    Hypertension Mother    Gout Mother    Cancer - Other Brother    Hypertension Brother    Asthma Brother    Heart attack Maternal Aunt     Social History   Tobacco Use   Smoking status: Never   Smokeless tobacco: Never  Substance Use Topics   Alcohol use: No    Marital Status: Married  ROS  Review of Systems  Cardiovascular:  Negative for dyspnea on exertion and near-syncope.  Neurological:  Negative for dizziness, light-headedness and weakness.   Objective  Blood pressure 126/61, pulse 66, height '5\' 2"'$  (1.575 m), weight 128 lb 6.4 oz (58.2 kg), SpO2 98 %. Body mass index is 23.48 kg/m.     02/03/2022    9:35 AM 01/15/2022   11:08 AM 12/01/2021   12:22 PM  Vitals with BMI  Height '5\' 2"'$  '5\' 2"'$    Weight 128 lbs 6 oz 129 lbs 8 oz   BMI 29.51 88.41   Systolic 660 630 160  Diastolic 61 66 81  Pulse 66 87 71     Physical Exam Vitals reviewed.  HENT:     Head: Normocephalic and atraumatic.  Neck:     Vascular: Carotid bruit present.  Cardiovascular:     Rate and Rhythm: Normal rate and regular rhythm.     Pulses: Normal pulses.     Heart sounds: Murmur heard.  Pulmonary:     Effort: Pulmonary effort is normal.     Breath sounds: Normal breath sounds.  Abdominal:     General: Bowel sounds are normal.  Musculoskeletal:  Right lower leg: No edema.     Left lower leg: No edema.  Skin:    General: Skin is warm and dry.  Neurological:     Mental Status: She is alert.     Medications and allergies   Allergies  Allergen Reactions   Cefdinir Other (See Comments)   Codeine Nausea And Vomiting   Escitalopram     Other Reaction(s): bad reaction to it , made her worse.   Mirtazapine     Other Reaction(s): after two days no help with sleep so stopped it   Oxycodone Nausea And Vomiting   Psyllium     Other Reaction(s): caused gerd   Raloxifene Other (See Comments)     Medication list after today's encounter   Current Outpatient Medications:    aspirin EC 81 MG tablet, Take 81 mg by mouth at bedtime., Disp: , Rfl:    benazepril (LOTENSIN) 40 MG tablet, Take 20 mg by mouth daily., Disp: , Rfl:    Calcium Carbonate-Vit D-Min (CALTRATE 600+D PLUS PO), Take 1 tablet by mouth daily., Disp: , Rfl:    Cholecalciferol (VITAMIN D)  2000 UNITS tablet, Take 2,000 Units by mouth daily., Disp: , Rfl:    Coenzyme Q10 (CO Q-10) 100 MG CAPS, Take by mouth., Disp: , Rfl:    Multiple Vitamin (MULTIVITAMIN) tablet, Take 1 tablet by mouth daily., Disp: , Rfl:    Omega-3 Fatty Acids (FISH OIL) 1000 MG CAPS, Take 1 capsule by mouth daily. , Disp: , Rfl:    rosuvastatin (CRESTOR) 10 MG tablet, Take 1 tablet (10 mg total) by mouth at bedtime., Disp: 90 tablet, Rfl: 3   sertraline (ZOLOFT) 100 MG tablet, Take 100 mg by mouth daily., Disp: , Rfl:   Laboratory examination:   Lab Results  Component Value Date   NA 138 01/15/2022   K 4.5 01/15/2022   CO2 26 01/15/2022   GLUCOSE 87 01/15/2022   BUN 18 01/15/2022   CREATININE 0.83 01/15/2022   CALCIUM 9.5 01/15/2022   GFRNONAA >90 05/20/2011       Latest Ref Rng & Units 01/15/2022   12:16 PM 05/20/2011    3:25 PM 02/18/2011    3:42 AM  CMP  Glucose 70 - 99 mg/dL 87  72  104   BUN 6 - 23 mg/dL '18  14  7   '$ Creatinine 0.40 - 1.20 mg/dL 0.83  0.59  0.64   Sodium 135 - 145 mEq/L 138  140  135   Potassium 3.5 - 5.1 mEq/L 4.5  4.1  3.4   Chloride 96 - 112 mEq/L 103  107  102   CO2 19 - 32 mEq/L '26  24  27   '$ Calcium 8.4 - 10.5 mg/dL 9.5  9.4  8.1   Total Protein 6.0 - 8.3 g/dL 7.7     Total Bilirubin 0.2 - 1.2 mg/dL 0.4     Alkaline Phos 39 - 117 U/L 38     AST 0 - 37 U/L 19     ALT 0 - 35 U/L 15         Latest Ref Rng & Units 01/15/2022   12:16 PM 05/20/2011    3:25 PM 02/19/2011    3:43 AM  CBC  WBC 4.0 - 10.5 K/uL 7.3  7.3  9.8   Hemoglobin 12.0 - 15.0 g/dL 12.0  12.2  10.4   Hematocrit 36.0 - 46.0 % 35.8  36.9  29.9   Platelets 150.0 - 400.0  K/uL 159.0  208  93     Lipid Panel No results for input(s): "CHOL", "TRIG", "LDLCALC", "VLDL", "HDL", "CHOLHDL", "LDLDIRECT" in the last 8760 hours.  HEMOGLOBIN A1C No results found for: "HGBA1C", "MPG" TSH No results for input(s): "TSH" in the last 8760 hours.  External labs:     Radiology:    Cardiac Studies:    05/09/2021 Carotid Duplex Summary:  Right Carotid: Velocities in the right ICA are consistent with a 1-39% stenosis.  Left Carotid: Velocities in the left ICA are consistent with a 40-59% stenosis.  Vertebrals:  Bilateral vertebral arteries demonstrate antegrade flow.  Subclavians: Normal flow hemodynamics were seen in the left subclavian artery. Right subclavain artery flow is monophasic.   Carotid artery duplex 12/01/2021: Duplex suggests stenosis in the right internal carotid artery (minimal). Duplex suggests stenosis in the left internal carotid artery (50-69%). Antegrade right vertebral artery flow. Antegrade left vertebral artery flow. No significant change from report dated 05/09/2021. Follow up in six months is appropriate if clinically indicated.    Echocardiogram 12/01/2021: Normal LV systolic function with visual EF 55-60%. Left ventricle cavity is normal in size. Normal left ventricular wall thickness. Normal global wall motion. Doppler evidence of grade II (pseudonormal) diastolic dysfunction, elevated LAP. Calculated EF 57%. Left atrial cavity is moderately dilated. Trileaflet aortic valve. Trace aortic stenosis. Mild to moderate aortic regurgitation. Mild aortic valve leaflet thickening with mild calcification. Structurally normal mitral valve.  Moderate (Grade II) mitral regurgitation. Structurally normal tricuspid valve.  Mild to moderate tricuspid regurgitation. Mild pulmonary hypertension. RVSP measures 34 mmHg. Structurally normal pulmonic valve.  Mild pulmonic regurgitation. No prior available for comparison.    Lexiscan Tetrofosmin stress test 12/10/2021: Exercise nuclear stress test was performed using Bruce protocol. Patient reached 4.6 METS, and 92% of age predicted maximum heart rate. Exercise capacity was low. No chest pain reported. Heart rate and hemodynamic response were normal. Stress EKG revealed no ischemic changes. Normal myocardial perfusion.  Stress LVEF 29%. High risk study due to low rest and stress LVEF. Recommend correlation with echcoardiogram.   EKG:   11/26/2021: Sinus Rhythm. Low voltage in precordial leads. Normal axis, normal R wave progression, no evidence of ischemia  Assessment     ICD-10-CM   1. Left carotid bruit  R09.89 PCV CAROTID DUPLEX (BILATERAL)    2. Essential hypertension  I10     3. Mixed hyperlipidemia  E78.2        No orders of the defined types were placed in this encounter.   Meds ordered this encounter  Medications   rosuvastatin (CRESTOR) 10 MG tablet    Sig: Take 1 tablet (10 mg total) by mouth at bedtime.    Dispense:  90 tablet    Refill:  3    Medications Discontinued During This Encounter  Medication Reason   amLODipine (NORVASC) 5 MG tablet Discontinued by provider   simvastatin (ZOCOR) 10 MG tablet      Recommendations:   DRENDA SOBECKI is a 82 y.o.  female with HTN, HLD, and a carotid bruit    Essential hypertension Continue current cardiac medications. Encourage low-sodium diet, less than 2000 mg daily. Echo and stress test within normal limits   Mixed hyperlipidemia Continue statin, will switch to crestor   Left carotid bruit Carotid duplex shows stable carotid plaque Will repeat next year  Follow-up in 3-6 months or sooner if needed     Beth Flock, DO, [facility]  02/07/2022, 4:53 PM Office: [phone] Pager: [phone]

## 2022-02-24 ENCOUNTER — Encounter: Payer: Self-pay | Admitting: Gastroenterology

## 2022-02-24 ENCOUNTER — Ambulatory Visit (AMBULATORY_SURGERY_CENTER): Payer: Medicare Other | Admitting: Gastroenterology

## 2022-02-24 VITALS — BP 155/80 | HR 76 | Temp 98.4°F | Resp 17 | Ht 62.0 in | Wt 129.0 lb

## 2022-02-24 DIAGNOSIS — R1310 Dysphagia, unspecified: Secondary | ICD-10-CM | POA: Diagnosis not present

## 2022-02-24 DIAGNOSIS — K921 Melena: Secondary | ICD-10-CM

## 2022-02-24 DIAGNOSIS — K2970 Gastritis, unspecified, without bleeding: Secondary | ICD-10-CM | POA: Diagnosis not present

## 2022-02-24 DIAGNOSIS — D122 Benign neoplasm of ascending colon: Secondary | ICD-10-CM

## 2022-02-24 DIAGNOSIS — M6289 Other specified disorders of muscle: Secondary | ICD-10-CM

## 2022-02-24 DIAGNOSIS — D649 Anemia, unspecified: Secondary | ICD-10-CM

## 2022-02-24 DIAGNOSIS — D500 Iron deficiency anemia secondary to blood loss (chronic): Secondary | ICD-10-CM

## 2022-02-24 DIAGNOSIS — K2951 Unspecified chronic gastritis with bleeding: Secondary | ICD-10-CM | POA: Diagnosis not present

## 2022-02-24 DIAGNOSIS — R1319 Other dysphagia: Secondary | ICD-10-CM

## 2022-02-24 DIAGNOSIS — B9681 Helicobacter pylori [H. pylori] as the cause of diseases classified elsewhere: Secondary | ICD-10-CM | POA: Diagnosis not present

## 2022-02-24 MED ORDER — PANTOPRAZOLE SODIUM 40 MG PO TBEC: DELAYED_RELEASE_TABLET | ORAL | 0 refills | Status: DC

## 2022-02-24 MED ORDER — SODIUM CHLORIDE 0.9 % IV SOLN: 500.0000 mL | Freq: Once | INTRAVENOUS | Status: DC

## 2022-02-24 NOTE — Patient Instructions (Addendum)
COLONOSCOPY:   A card to carry in your wallet given to you today.This card indicates that you have 2 clips that were placed in your ascending colon during a colonoscopy on 1 /30/2024.   Handouts on polyps ,diverticulosis, & hemorrhoids given to you today   Await pathology results on polyp removed today    ENDOSCOPY:   Follow dilatation diet given to you today- your esophagus was dilated today- soft foods today per Dr Silverio Decamp  TAKE PROTONIX 40 MG DAILY FOR 3 MONTHS - this med sent to [facility] for pick up   Handouts on gastritis given to you  today   Gastric ulcer was seen - biopsies taken  Await biopsy results     YOU HAD AN ENDOSCOPIC PROCEDURE TODAY AT [place]:   Refer to the procedure report that was given to you for any specific questions about what was found during the examination.  If the procedure report does not answer your questions, please call your gastroenterologist to clarify.  If you requested that your care partner not be given the details of your procedure findings, then the procedure report has been included in a sealed envelope for you to review at your convenience later.  YOU SHOULD EXPECT: Some feelings of bloating in the abdomen. Passage of more gas than usual.  Walking can help get rid of the air that was put into your GI tract during the procedure and reduce the bloating. If you had a lower endoscopy (such as a colonoscopy or flexible sigmoidoscopy) you may notice spotting of blood in your stool or on the toilet paper. If you underwent a bowel prep for your procedure, you may not have a normal bowel movement for a few days.  Please Note:  You might notice some irritation and congestion in your nose or some drainage.  This is from the oxygen used during your procedure.  There is no need for concern and it should clear up in a day or so.  SYMPTOMS TO REPORT IMMEDIATELY:  Following lower endoscopy (colonoscopy or flexible  sigmoidoscopy):  Excessive amounts of blood in the stool  Significant tenderness or worsening of abdominal pains  Swelling of the abdomen that is new, acute  Fever of 100F or higher  Following upper endoscopy (EGD)  Vomiting of blood or coffee ground material  New chest pain or pain under the shoulder blades  Painful or persistently difficult swallowing  New shortness of breath  Fever of 100F or higher  Black, tarry-looking stools  For urgent or emergent issues, a gastroenterologist can be reached at any hour by calling [phone]. Do not use MyChart messaging for urgent concerns.    DIET:  We do recommend a small meal at first, but then you may proceed to your regular diet.  Drink plenty of fluids but you should avoid alcoholic beverages for 24 hours.  ACTIVITY:  You should plan to take it easy for the rest of today and you should NOT DRIVE or use heavy machinery until tomorrow (because of the sedation medicines used during the test).    FOLLOW UP: Our staff will call the number listed on your records the next business day following your procedure.  We will call around 7:15- 8:00 am to check on you and address any questions or concerns that you may have regarding the information given to you following your procedure. If we do not reach you, we will leave a message.     If any  biopsies were taken you will be contacted by phone or by letter within the next 1-3 weeks.  Please call us at [phone] if you have not heard about the biopsies in 3 weeks.    SIGNATURES/CONFIDENTIALITY: You and/or your care partner have signed paperwork which will be entered into your electronic medical record.  These signatures attest to the fact that that the information above on your After Visit Summary has been reviewed and is understood.  Full responsibility of the confidentiality of this discharge information lies with you and/or your care-partner.

## 2022-02-24 NOTE — Op Note (Signed)
[facility] Patient Name: Beth Burke Procedure Date: 02/24/2022 2:59 PM MRN: [id number] Endoscopist: Mauri Pole , MD, [id number] Age: 82 Referring MD:  Date of Birth: Aug 18, 1940 Gender: Female Account #: [id number] Procedure:                Colonoscopy Indications:              Unexplained iron deficiency anemia Medicines:                Monitored Anesthesia Care Procedure:                Pre-Anesthesia Assessment:                           - Prior to the procedure, a History and Physical                            was performed, and patient medications and                            allergies were reviewed. The patient's tolerance of                            previous anesthesia was also reviewed. The risks                            and benefits of the procedure and the sedation                            options and risks were discussed with the patient.                            All questions were answered, and informed consent                            was obtained. Prior Anticoagulants: The patient has                            taken no anticoagulant or antiplatelet agents. ASA                            Grade Assessment: II - A patient with mild systemic                            disease. After reviewing the risks and benefits,                            the patient was deemed in satisfactory condition to                            undergo the procedure.                           After obtaining informed consent, the colonoscope  was passed under direct vision. Throughout the                            procedure, the patient's blood pressure, pulse, and                            oxygen saturations were monitored continuously. The                            Olympus PCF-H190DL [id number]) Colonoscope was                            introduced through the anus and advanced to the the                            cecum, identified by  appendiceal orifice and                            ileocecal valve. The colonoscopy was performed                            without difficulty. The patient tolerated the                            procedure well. The quality of the bowel                            preparation was good. The ileocecal valve,                            appendiceal orifice, and rectum were photographed. Scope In: 3:17:00 PM Scope Out: 3:36:35 PM Scope Withdrawal Time: 0 hours 11 minutes 53 seconds  Total Procedure Duration: 0 hours 19 minutes 35 seconds  Findings:                 The perianal and digital rectal examinations were                            normal.                           A 7 mm polyp was found in the ascending colon. The                            polyp was sessile. The polyp was removed with a                            cold snare. Resection and retrieval were complete.                            To prevent bleeding after the polypectomy, two                            hemostatic clips were successfully placed (MR  conditional). Clip manufacturer: [employer]. There                            was no bleeding at the end of the procedure.                           Scattered small-mouthed diverticula were found in                            the sigmoid colon and descending colon.                           Non-bleeding external and internal hemorrhoids were                            found during retroflexion. The hemorrhoids were                            large. Complications:            No immediate complications. Estimated Blood Loss:     Estimated blood loss was minimal. Impression:               - One 7 mm polyp in the ascending colon, removed                            with a cold snare. Resected and retrieved. Clips                            (MR conditional) were placed. Clip manufacturer:                            [employer].                           -  Diverticulosis in the sigmoid colon and in the                            descending colon.                           - Non-bleeding external and internal hemorrhoids. Recommendation:           - Resume previous diet.                           - Continue present medications.                           - Await pathology results.                           - No repeat colonoscopy due to age. Mauri Pole, MD 02/24/2022 3:50:33 PM This report has been signed electronically.

## 2022-02-24 NOTE — Progress Notes (Unsigned)
A and O x3. Report to RN. Tolerated MAC anesthesia well.Teeth unchanged after procedure. 

## 2022-02-24 NOTE — Progress Notes (Unsigned)
[facility] History and Physical   Primary Care Physician:  Crist Infante, MD   Reason for Procedure:  Iron deficiency anemia, dysphagia  Plan:    EGD and colonoscopy with possible interventions as needed     HPI: Beth Burke is a very pleasant 82 y.o. female here for evaluation of iron deficiency anemia.   The risks and benefits as well as alternatives of endoscopic procedure(s) have been discussed and reviewed. All questions answered. The patient agrees to proceed.    Past Medical History:  Diagnosis Date   Anxiety    Arthritis    Blood transfusion    WITH KNEE REPLACEMENT 01/2011   Elevated cholesterol    GERD (gastroesophageal reflux disease)    Heart murmur    Hypertension    Scarlet fever as child   Sleep apnea    mild sleep apnea, no cpap used   Urinary incontinence    wears pads   Varicose veins    Vertigo     Past Surgical History:  Procedure Laterality Date   ABDOMINAL HYSTERECTOMY  01/26/1982   CERVICAL FUSION  04/27/2010   C5 to C7 with plates--PT HAS LIMITED ROM AND PAIN   COLONOSCOPY WITH ESOPHAGOGASTRODUODENOSCOPY (EGD)     KNEE CLOSED REDUCTION  05/22/2011   Procedure: CLOSED MANIPULATION KNEE;  Surgeon: Gearlean Alf, MD;  Location: [facility];  Service: Orthopedics;  Laterality: Right;   right lower leg surgery  01/27/1960   wire placed for crushed leg    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 Authorizing Provider  aspirin EC 81 MG tablet Take 81 mg by mouth at bedtime.   Yes [provider]  benazepril (LOTENSIN) 40 MG tablet Take 20 mg by mouth daily. 08/07/14  Yes [provider]  Calcium Carbonate-Vit D-Min (CALTRATE 600+D PLUS PO) Take 1 tablet by mouth daily.   Yes [provider]   Cholecalciferol (VITAMIN D) 2000 UNITS tablet Take 2,000 Units by mouth daily.   Yes [provider]  Coenzyme Q10 (CO Q-10) 100 MG CAPS Take by mouth.   Yes [provider]  Multiple Vitamin (MULTIVITAMIN) tablet Take 1 tablet by mouth daily.   Yes [provider]  Omega-3 Fatty Acids (FISH OIL) 1000 MG CAPS Take 1 capsule by mouth daily.    Yes [provider]  rosuvastatin (CRESTOR) 10 MG tablet Take 1 tablet (10 mg total) by mouth at bedtime. 02/03/22 05/04/22 Yes Custovic, Collene Mares, DO  sertraline (ZOLOFT) 100 MG tablet Take 100 mg by mouth daily. 03/02/18  Yes [provider]    Current Outpatient Medications  Medication Sig Dispense Refill   aspirin EC 81 MG tablet Take 81 mg by mouth at bedtime.     benazepril (LOTENSIN) 40 MG tablet Take 20 mg by mouth daily.     Calcium Carbonate-Vit D-Min (CALTRATE 600+D PLUS PO) Take 1 tablet by mouth daily.     Cholecalciferol (VITAMIN D) 2000 UNITS tablet Take 2,000 Units by mouth daily.     Coenzyme Q10 (CO Q-10) 100 MG CAPS Take by mouth.     Multiple Vitamin (MULTIVITAMIN) tablet Take 1 tablet by mouth daily.     Omega-3 Fatty Acids (FISH OIL) 1000 MG CAPS Take 1 capsule by mouth daily.  rosuvastatin (CRESTOR) 10 MG tablet Take 1 tablet (10 mg total) by mouth at bedtime. 90 tablet 3   sertraline (ZOLOFT) 100 MG tablet Take 100 mg by mouth daily.     Current Facility-Administered Medications  Medication Dose Route Frequency Provider Last Rate Last Admin   0.9 %  sodium chloride infusion  500 mL Intravenous Once Mauri Pole, MD        Allergies as of 02/24/2022 - Review Complete 02/24/2022  Allergen Reaction Noted   Cefdinir Other (See Comments) 02/03/2022   Codeine Nausea And Vomiting and Other (See Comments) 02/27/2009   Escitalopram  09/12/2012   Mirtazapine  11/17/2017   Oxycodone Nausea And Vomiting 05/20/2011   Psyllium  10/30/2013   Raloxifene Other (See Comments) 02/27/2009     Family History  Problem Relation Age of Onset   Colon polyps Mother    Allergies Mother    Hypertension Mother    Gout Mother    Colon polyps Sister    Cancer - Other Brother    Hypertension Brother    Asthma Brother    Heart attack Maternal Aunt    Colon cancer Neg Hx    Esophageal cancer Neg Hx    Rectal cancer Neg Hx    Stomach cancer Neg Hx     Social History   Socioeconomic History   Marital status: Married    Spouse name: Not on file   Number of children: 2   Years of education: Hotel manager   Highest education level: Not on file  Occupational History   Occupation: Semi-Retired    Fish farm manager: SELF-EMPLOYED   Occupation: Hairstylist  Tobacco Use   Smoking status: Never   Smokeless tobacco: Never  Scientific laboratory technician Use: Never used  Substance and Sexual Activity   Alcohol use: No   Drug use: No   Sexual activity: Not Currently    Birth control/protection: Surgical, Post-menopausal  Other Topics Concern   Not on file  Social History Narrative   Not on file   Social Determinants of Health   Financial Resource Strain: Not on file  Food Insecurity: Not on file  Transportation Needs: Not on file  Physical Activity: Not on file  Stress: Not on file  Social Connections: Not on file  Intimate Partner Violence: Not on file    Review of Systems:  All other review of systems negative except as mentioned in the HPI.  Physical Exam: Vital signs in last 24 hours: Blood Pressure 118/64   Pulse 68   Temperature 98.4 F (36.9 C) (Temporal)   Height '5\' 2"'$  (1.575 m)   Weight 129 lb (58.5 kg)   Oxygen Saturation 97%   Body Mass Index 23.59 kg/m  General:   Alert, NAD Lungs:  Clear .   Heart:  Regular rate and rhythm Abdomen:  Soft, nontender and nondistended. Neuro/Psych:  Alert and cooperative. Normal mood and affect. A and O x 3  Reviewed labs, radiology imaging, old records and pertinent past GI work up  Patient is appropriate for planned  procedure(s) and anesthesia in an ambulatory setting   K. Denzil Magnuson , MD [phone]

## 2022-02-24 NOTE — Progress Notes (Unsigned)
Called to room to assist during endoscopic procedure.  Patient ID and intended procedure confirmed with present staff. Received instructions for my participation in the procedure from the performing physician.  

## 2022-02-24 NOTE — Op Note (Signed)
[facility] Patient Name: Beth Burke Procedure Date: 02/24/2022 3:00 PM MRN: [id number] Endoscopist: Mauri Pole , MD, [id number] Age: 82 Referring MD:  Date of Birth: 17-Apr-1940 Gender: Female Account #: [id number] Procedure:                Upper GI endoscopy Indications:              Suspected upper gastrointestinal bleeding in                            patient with unexplained iron deficiency anemia,                            Dysphagia Medicines:                Monitored Anesthesia Care Procedure:                Pre-Anesthesia Assessment:                           - Prior to the procedure, a History and Physical                            was performed, and patient medications and                            allergies were reviewed. The patient's tolerance of                            previous anesthesia was also reviewed. The risks                            and benefits of the procedure and the sedation                            options and risks were discussed with the patient.                            All questions were answered, and informed consent                            was obtained. Prior Anticoagulants: The patient has                            taken no anticoagulant or antiplatelet agents. ASA                            Grade Assessment: II - A patient with mild systemic                            disease. After reviewing the risks and benefits,                            the patient was deemed in satisfactory condition to  undergo the procedure.                           After obtaining informed consent, the endoscope was                            passed under direct vision. Throughout the                            procedure, the patient's blood pressure, pulse, and                            oxygen saturations were monitored continuously. The                            Endoscope was introduced through the mouth,  and                            advanced to the second part of duodenum. The upper                            GI endoscopy was accomplished without difficulty.                            The patient tolerated the procedure well. Scope In: Scope Out: Findings:                 The Z-line was regular and was found 36 cm from the                            incisors.                           No endoscopic abnormality was evident in the                            esophagus to explain the patient's complaint of                            dysphagia. It was decided, however, to proceed with                            dilation of the entire esophagus. The scope was                            withdrawn. Dilation was performed with a Maloney                            dilator with no resistance at 48 Fr.                           One non-bleeding superficial gastric ulcer with no                            stigmata of  bleeding was found in the prepyloric                            region of the stomach. The lesion was 6 mm in                            largest dimension. Biopsies were taken with a cold                            forceps for histology.                           Patchy mild inflammation characterized by                            congestion (edema), erosions, erythema and                            granularity was found in the entire examined                            stomach. Biopsies were taken with a cold forceps                            for Helicobacter pylori testing.                           The examined duodenum was normal.                           The cardia and gastric fundus were normal on                            retroflexion. Complications:            No immediate complications. Estimated Blood Loss:     Estimated blood loss was minimal. Impression:               - Z-line regular, 36 cm from the incisors.                           - No endoscopic esophageal  abnormality to explain                            patient's dysphagia. Esophagus dilated. Dilated.                           - Non-bleeding gastric ulcer with no stigmata of                            bleeding. Biopsied.                           - Gastritis. Biopsied.                           - Normal  examined duodenum. Recommendation:           - Resume previous diet.                           - Continue present medications.                           - Await pathology results.                           - No ibuprofen, naproxen, or other non-steroidal                            anti-inflammatory drugs.                           - Use Protonix (pantoprazole) 40 mg PO daily for 3                            months. Mauri Pole, MD 02/24/2022 3:54:20 PM This report has been signed electronically.

## 2022-02-24 NOTE — Progress Notes (Unsigned)
Pt's states no medical or surgical changes since previsit or office visit. 

## 2022-02-25 ENCOUNTER — Telehealth: Payer: Self-pay | Admitting: *Deleted

## 2022-02-25 ENCOUNTER — Encounter: Payer: Self-pay | Admitting: Gastroenterology

## 2022-02-25 NOTE — Telephone Encounter (Signed)
  Follow up Call-     02/24/2022    2:01 PM  Call back number  Post procedure Call Back phone  # [phone]  Permission to leave phone message Yes     Patient questions:  Do you have a fever, pain , or abdominal swelling? No. Pain Score  0 *  Have you tolerated food without any problems? Yes.    Have you been able to return to your normal activities? No.  Do you have any questions about your discharge instructions: Diet   No. Medications  No. Follow up visit  No.  Do you have questions or concerns about your Care? No.  Actions: * If pain score is 4 or above: No action needed, pain <4.

## 2022-03-12 ENCOUNTER — Encounter: Payer: Self-pay | Admitting: Gastroenterology

## 2022-03-16 ENCOUNTER — Telehealth: Payer: Self-pay | Admitting: Gastroenterology

## 2022-03-16 NOTE — Telephone Encounter (Signed)
  Left message on machine to call back    H.pylori positive. Please inform patient the results. Thanks   Please send prescription for Talicia for 14 days or Pylera X 10 days and PPI BID, if not covered by insurance send Rx for Bismuth 524 mg four times daily, Flagyl 254m 1 tablet four times daily, Doxycycline 1053mcapsule Twice daily X 10 days along with PPI BID (Lansoprazole, Omeprazole or Nexium).   Will need to confirm eradication in 6 weeks by checking H.pylori stool Ag, off PPI for 2 weeks prior to the test.

## 2022-03-16 NOTE — Telephone Encounter (Signed)
See results note dated 2/19

## 2022-03-16 NOTE — Telephone Encounter (Signed)
Inbound call from patient, requesting to speak with a nurse in regards to her EGD and colonoscopy results. Please advise.

## 2022-03-17 ENCOUNTER — Other Ambulatory Visit: Payer: Self-pay | Admitting: Gastroenterology

## 2022-03-17 ENCOUNTER — Other Ambulatory Visit: Payer: Self-pay

## 2022-03-17 MED ORDER — BISMUTH/METRONIDAZ/TETRACYCLIN 140-125-125 MG PO CAPS: 3.0000 | ORAL_CAPSULE | Freq: Four times a day (QID) | ORAL | 0 refills | Status: DC

## 2022-03-19 ENCOUNTER — Other Ambulatory Visit: Payer: Self-pay

## 2022-03-19 MED ORDER — DOXYCYCLINE HYCLATE 100 MG PO CAPS: 100.0000 mg | ORAL_CAPSULE | Freq: Two times a day (BID) | ORAL | 0 refills | Status: AC

## 2022-03-19 MED ORDER — BISMUTH SUBSALICYLATE 262 MG PO TABS: 2.0000 | ORAL_TABLET | Freq: Four times a day (QID) | ORAL | 0 refills | Status: AC

## 2022-03-19 MED ORDER — METRONIDAZOLE 250 MG PO TABS: 250.0000 mg | ORAL_TABLET | Freq: Four times a day (QID) | ORAL | 0 refills | Status: AC

## 2022-03-19 NOTE — Telephone Encounter (Addendum)
Patient called stating that her insurance will not cover medication and the pharmacy had faxed over information and had not heard anything from our practice. Patient is requesting a call back to discuss what the next steps are, patient stated she would like a call back before 2:00 because she will be going out. Please advise.

## 2022-03-19 NOTE — Telephone Encounter (Signed)
The pt has been advised that individual components have been sent to her pharmacy.

## 2022-03-25 ENCOUNTER — Telehealth: Payer: Self-pay

## 2022-03-25 NOTE — Telephone Encounter (Signed)
Patient calling with rectal pain from hemorrhoids with bleeding and diarrhea. Patient can feel the bulging when she cleans after her bowel movements. She started having diarrhea when she started the Doxy and the Flagyl for her H Pylori treatment. She is using OTC hemorrhoid ointment. She is "blotting with toilet paper to clean or using a wash cloth." Discussed her H Pylori treatment. She had not taken the bismuth as previously instructed because the antibiotics said not to. Discussed spacing of the medications. She will go get the bismuth tonight.  Do you have any recommendations?

## 2022-04-02 NOTE — Telephone Encounter (Signed)
This has been canceled. Please sign off encounter to clear rx request in the in basket

## 2022-04-15 ENCOUNTER — Ambulatory Visit
Admission: EM | Admit: 2022-04-15 | Discharge: 2022-04-15 | Disposition: A | Discharge status: Home / Self Care | Payer: Medicare Other | Attending: Family Medicine | Admitting: Family Medicine

## 2022-04-15 DIAGNOSIS — Z1152 Encounter for screening for COVID-19: Secondary | ICD-10-CM | POA: Diagnosis not present

## 2022-04-15 DIAGNOSIS — J0100 Acute maxillary sinusitis, unspecified: Secondary | ICD-10-CM | POA: Diagnosis present

## 2022-04-15 MED ORDER — DOXYCYCLINE HYCLATE 100 MG PO CAPS: 100.0000 mg | ORAL_CAPSULE | Freq: Two times a day (BID) | ORAL | 0 refills | Status: DC

## 2022-04-15 NOTE — ED Triage Notes (Signed)
Pt c/o ears plugged up, headache, sinus congestion, nasal congestion, sneezing, horse voice at night  Denies sore throat,   Onset ~ several days ago

## 2022-04-15 NOTE — ED Provider Notes (Signed)
[facility]   [id number] 04/15/22 Arrival Time: [id number]  ASSESSMENT & PLAN:  1. Acute non-recurrent maxillary sinusitis    COVID testing sent at pt request. Labs Reviewed  SARS CORONAVIRUS 2 (TAT 6-24 HRS)   Begin: Meds ordered this encounter  Medications   doxycycline (VIBRAMYCIN) 100 MG capsule    Sig: Take 1 capsule (100 mg total) by mouth 2 (two) times daily.    Dispense:  20 capsule    Refill:  0   OTC symptom care as needed. Ensure adequate fluid intake and rest.   Follow-up Information     Crist Infante, MD.   Specialty: Internal Medicine Why: If worsening or failing to improve as anticipated. Contact information: [address] [place] [address] [phone]                 Reviewed expectations re: course of current medical issues. Questions answered. Outlined signs and symptoms indicating need for more acute intervention. Patient verbalized understanding. After Visit Summary given.   SUBJECTIVE: History from: patient.  Beth Burke is a 82 y.o. female who presents with complaint of nasal congestion, post-nasal drainage, and sinus pain. Onset gradual,  over one week ago . Respiratory symptoms: none. Fever: denies. Overall normal PO intake without n/v. No treatment PTA. History of frequent sinus infections: no. No specific aggravating or alleviating factors reported.  Social History   Tobacco Use  Smoking Status Never  Smokeless Tobacco Never    OBJECTIVE:  Vitals:   04/15/22 1411  BP: 134/74  Pulse: 79  Resp: 18  Temp: 97.6 F (36.4 C)  TempSrc: Oral  SpO2: 97%     General appearance: alert; no distress HEENT: nasal congestion; clear runny nose; throat irritation secondary to post-nasal drainage; bilateral maxillary tenderness to palpation; turbinates boggy Neck: supple without LAD; trachea midline Lungs: unlabored respirations, symmetrical air entry; cough: absent; no respiratory distress Skin: warm and  dry Psychological: alert and cooperative; normal mood and affect  Allergies  Allergen Reactions   Cefdinir Other (See Comments)   Codeine Nausea And Vomiting and Other (See Comments)   Escitalopram     Other Reaction(s): bad reaction to it , made her worse.   Mirtazapine     Other Reaction(s): after two days no help with sleep so stopped it   Oxycodone Nausea And Vomiting   Psyllium     Other Reaction(s): caused gerd   Raloxifene Other (See Comments)    Past Medical History:  Diagnosis Date   Anxiety    Arthritis    Blood transfusion    WITH KNEE REPLACEMENT 01/2011   Elevated cholesterol    GERD (gastroesophageal reflux disease)    Heart murmur    Hypertension    Scarlet fever as child   Sleep apnea    mild sleep apnea, no cpap used   Urinary incontinence    wears pads   Varicose veins    Vertigo    Family History  Problem Relation Age of Onset   Colon polyps Mother    Allergies Mother    Hypertension Mother    Gout Mother    Colon polyps Sister    Cancer - Other Brother    Hypertension Brother    Asthma Brother    Heart attack Maternal Aunt    Colon cancer Neg Hx    Esophageal cancer Neg Hx    Rectal cancer Neg Hx    Stomach cancer Neg Hx    Social History  Socioeconomic History   Marital status: Married    Spouse name: Not on file   Number of children: 2   Years of education: Hotel manager   Highest education level: Not on file  Occupational History   Occupation: Semi-Retired    Fish farm manager: SELF-EMPLOYED   Occupation: Hairstylist  Tobacco Use   Smoking status: Never   Smokeless tobacco: Never  Scientific laboratory technician Use: Never used  Substance and Sexual Activity   Alcohol use: No   Drug use: No   Sexual activity: Not Currently    Birth control/protection: Surgical, Post-menopausal  Other Topics Concern   Not on file  Social History Narrative   Not on file   Social Determinants of Health   Financial Resource Strain: Not on file  Food  Insecurity: Not on file  Transportation Needs: Not on file  Physical Activity: Not on file  Stress: Not on file  Social Connections: Not on file  Intimate Partner Violence: Not on file             [place], MD 04/15/22 1504

## 2022-04-16 LAB — SARS CORONAVIRUS 2 (TAT 6-24 HRS): SARS Coronavirus 2: NEGATIVE

## 2022-04-21 NOTE — Telephone Encounter (Signed)
Please send prescription for hydrocortisone suppository twice daily for 7 days.  Unfortunately antibiotics can sometimes lead to change in bowel habits and diarrhea.  If she continues to have persistent diarrhea even after stopping antibiotics, will proceed.  Please advise patient to call back if she has persistent diarrhea.

## 2022-04-21 NOTE — Telephone Encounter (Signed)
Called the patient to discuss. No answer. Left her a voicemail of my call. Asked she call back with an update of her symptoms.

## 2022-05-19 ENCOUNTER — Other Ambulatory Visit: Payer: Self-pay

## 2022-05-19 DIAGNOSIS — A048 Other specified bacterial intestinal infections: Secondary | ICD-10-CM

## 2022-06-01 ENCOUNTER — Telehealth: Payer: Self-pay | Admitting: Gastroenterology

## 2022-06-01 NOTE — Telephone Encounter (Signed)
PT has lab order in for stool sample and she wants to know does she come in for a kit or does she have to bring a sample. Please advise.

## 2022-06-02 NOTE — Telephone Encounter (Signed)
Inbound call from patient, asking about stool kit. Advised patient lab would be able to provide kit for her if order was placed.

## 2022-06-02 NOTE — Telephone Encounter (Signed)
Spoke with patient. No further questions. Beth Burke has answered them.

## 2022-06-04 ENCOUNTER — Ambulatory Visit: Payer: Medicare Other

## 2022-06-04 DIAGNOSIS — A048 Other specified bacterial intestinal infections: Secondary | ICD-10-CM

## 2022-06-06 LAB — H. PYLORI ANTIGEN, STOOL: H pylori Ag, Stl: NEGATIVE

## 2022-08-04 ENCOUNTER — Ambulatory Visit: Payer: Medicare Other

## 2022-08-04 DIAGNOSIS — R0989 Other specified symptoms and signs involving the circulatory and respiratory systems: Secondary | ICD-10-CM

## 2022-08-12 ENCOUNTER — Ambulatory Visit: Payer: Medicare Other | Admitting: Cardiology

## 2022-08-14 ENCOUNTER — Encounter: Payer: Self-pay | Admitting: Cardiology

## 2022-08-14 ENCOUNTER — Ambulatory Visit: Payer: Medicare Other | Admitting: Cardiology

## 2022-08-14 VITALS — BP 129/69 | HR 73 | Resp 16 | Ht 62.0 in | Wt 129.0 lb

## 2022-08-14 DIAGNOSIS — I6523 Occlusion and stenosis of bilateral carotid arteries: Secondary | ICD-10-CM

## 2022-08-14 DIAGNOSIS — I351 Nonrheumatic aortic (valve) insufficiency: Secondary | ICD-10-CM | POA: Insufficient documentation

## 2022-08-14 DIAGNOSIS — I340 Nonrheumatic mitral (valve) insufficiency: Secondary | ICD-10-CM

## 2022-08-14 NOTE — Progress Notes (Signed)
Follow up visit  Subjective:   Beth Burke, female    DOB: 10-21-1940, 82 y.o.   MRN: [id number]    HPI  Chief Complaint  Patient presents with   Left carotid bruit    82 y.o. Caucasian female with hypertension, controlled hyperlipidemia, moderate carotid artery disease  Patient previously underwent echocardiogram and exercise nuclear stress test. Echocardiogram shows normal EF, non-severe valvular regurgitation-detailed below, stress test showed low EF at rest and stress that did not corroborate with echocardiogram, normal myocardial perfusion.  Patient continues to have symptoms or lightheadedness, particularly with standing. Orthostatics have been negative. She denies TIA/stroke symptoms.    Current Outpatient Medications:    aspirin EC 81 MG tablet, Take 81 mg by mouth at bedtime., Disp: , Rfl:    Calcium Carbonate-Vit D-Min (CALTRATE 600+D PLUS PO), Take 1 tablet by mouth daily., Disp: , Rfl:    Cholecalciferol (VITAMIN D) 2000 UNITS tablet, Take 2,000 Units by mouth daily., Disp: , Rfl:    Coenzyme Q10 (CO Q-10) 100 MG CAPS, Take by mouth., Disp: , Rfl:    doxycycline (VIBRAMYCIN) 100 MG capsule, Take 1 capsule (100 mg total) by mouth 2 (two) times daily., Disp: 20 capsule, Rfl: 0   Multiple Vitamin (MULTIVITAMIN) tablet, Take 1 tablet by mouth daily., Disp: , Rfl:    Omega-3 Fatty Acids (FISH OIL) 1000 MG CAPS, Take 1 capsule by mouth daily. , Disp: , Rfl:    pantoprazole (PROTONIX) 40 MG tablet, TAKE 1 TABLET BY MOUTH EVERY DAY, Disp: 90 tablet, Rfl: 0   rosuvastatin (CRESTOR) 10 MG tablet, Take 1 tablet (10 mg total) by mouth at bedtime., Disp: 90 tablet, Rfl: 3   sertraline (ZOLOFT) 100 MG tablet, Take 100 mg by mouth daily., Disp: , Rfl:    Cardiovascular & other pertient studies:  Reviewed external labs and tests, independently interpreted  EKG 08/14/2022: Sinus rhythm 64 bpm IVCD  Carotid artery duplex 08/04/2022: Duplex suggests stenosis in the right internal  carotid artery (1-15%). Mild homogeneous plaque. Duplex suggests stenosis in the left internal carotid artery (50-69%) with homogeneous plaque. <50% stenosis in the left external carotid artery and mild stenosis in the left CCA with heterogeneous plaque. Antegrade right vertebral artery flow. Antegrade left vertebral artery flow. Compared to the study done on 12/01/2021, no significant change Follow up in six months is appropriate if clinically indicated.   Echocardiogram 12/01/2021: Normal LV systolic function with visual EF 55-60%. Left ventricle cavity is normal in size. Normal left ventricular wall thickness. Normal global wall motion. Doppler evidence of grade I (pseudonormal) diastolic dysfunction, normal LAP (as per my read).  Left atrial cavity is moderately dilated. Trileaflet aortic valve. Trace aortic stenosis. Mild to moderate aortic regurgitation. Mild aortic valve leaflet thickening with mild calcification. Structurally normal mitral valve.  Moderate (Grade II) mitral regurgitation. Structurally normal tricuspid valve.  Mild to moderate tricuspid regurgitation. Mild pulmonary hypertension. RVSP measures 34 mmHg. Structurally normal pulmonic valve.  Mild pulmonic regurgitation. No prior available for comparison.  Lexiscan Tetrofosmin stress test 12/10/2021: Exercise nuclear stress test was performed using Bruce protocol. Patient reached 4.6 METS, and 92% of age predicted maximum heart rate. Exercise capacity was low. No chest pain reported. Heart rate and hemodynamic response were normal. Stress EKG revealed no ischemic changes. Normal myocardial perfusion. Stress LVEF 29%. High risk study due to low rest and stress LVEF. Recommend correlation with echcoardiogram.  Recent labs: 04/27/2022: Glucose NA, BUN/Cr 14/0.7. EGFR 66. K 4.5 Chol 150, TG  68, HDL 94, LDL 57 TSH 2.5 normal  01/15/2022: Glucose 87, BUN/Cr 18/0.83. EGFR 66. Na/K 138/4.5. Rest of the CMP normal H/H  12/35. MCV 90. Platelets 159   Review of Systems  Cardiovascular:  Negative for chest pain, dyspnea on exertion, leg swelling, palpitations and syncope.  Neurological:  Positive for light-headedness.         Vitals:   08/14/22 1434  BP: 129/69  Pulse: 73  Resp: 16  SpO2: 95%    Body mass index is 23.59 kg/m. Filed Weights   08/14/22 1434  Weight: 129 lb (58.5 kg)     Objective:   Physical Exam Vitals and nursing note reviewed.  Constitutional:      General: She is not in acute distress. Neck:     Vascular: No JVD.  Cardiovascular:     Rate and Rhythm: Normal rate and regular rhythm.     Pulses:          Carotid pulses are  on the right side with bruit and  on the left side with bruit.    Heart sounds: Normal heart sounds. No murmur heard. Pulmonary:     Effort: Pulmonary effort is normal.     Breath sounds: Normal breath sounds. No wheezing or rales.  Musculoskeletal:     Right lower leg: No edema.     Left lower leg: No edema.             Visit diagnoses:   ICD-10-CM   1. 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 Nonrheumatic mitral valve regurgitation  I34.0 PCV ECHOCARDIOGRAM COMPLETE    3. Bilateral carotid artery stenosis  I65.23        Orders Placed This Encounter  Procedures   PCV ECHOCARDIOGRAM COMPLETE       Assessment & Recommendations:   82 y.o. Caucasian female with hypertension, controlled hyperlipidemia, moderate carotid artery disease  Lightheadedness: Orthostatic vitals negative. Moderate bilateral carotid disease does not explain her symptoms. Consider neurology evaluation, ?vertebrobasilar insufficeicny.  Carotid artery disease: Continue Aspirin, statin. Lipids well controlled.  Mild to mod MR, trace AS: Clinically asymptomatic. Repeat echocardiogram in 1 year.  Abnormal stress test: Stress test with low rest and stress EF, not corroborated with echocardiogram EF. Normal  myocardial perfusion. Low suspicion for obstructive CAD.   F/u in 1 year    Elder Negus, MD Pager: [phone] Office: [phone]

## 2022-10-03 NOTE — Progress Notes (Signed)
This encounter was created in error - please disregard.

## 2022-10-07 ENCOUNTER — Telehealth: Payer: Self-pay | Admitting: Gastroenterology

## 2022-10-07 NOTE — Telephone Encounter (Signed)
Inbound call from patient requesting to speak with a nurse in regards to abdominal pain. Please advise .

## 2022-10-07 NOTE — Telephone Encounter (Signed)
Spoke with patient. She has had vomiting and diarrhea over the past 24 hours. Much better in the past 6  to 8 hours. This afternoon she has eaten a scrambled egg and toast. She is drinking Gatorade. She has taken Imodium for her diarrhea. No loose stools since this morning.  Her concern was could she have "gotten that bacteria again" referring to H. Pylori. Reassured the patient her symptoms sound like a viral process. If she fails to improve or acutely worsens, she call us back.

## 2022-10-08 NOTE — Telephone Encounter (Signed)
Spoke with the patient. She is not feeling much better today. Very nauseated. The Imodium was last taken yesterday. Bowel moved producing a little loose stool. [place] Spaniel this is better than it was. Very fatigued. Drinking fluids. No appetite. No fever.

## 2022-10-08 NOTE — Telephone Encounter (Signed)
Patient called to follow up on message below. 

## 2022-10-09 NOTE — Telephone Encounter (Signed)
Discussed with the patient. She will continue to maintain hydration and add broth based soups for nutrition. Presently tolerating PO intake. No diarrhea. Call if she acutely worsens or fails to improve. She may need to come into ER for IV hydration if she is not tolerating any p.o. intake.

## 2022-10-09 NOTE — Telephone Encounter (Signed)
Less likely her symptoms are secondary to H. pylori.  Please advise patient to maintain hydration if she continues to have persistent vomiting and diarrhea, may need to come into ER for IV hydration if she is not tolerating any p.o. intake.  Thank you

## 2023-01-26 ENCOUNTER — Other Ambulatory Visit: Payer: Self-pay | Admitting: Internal Medicine

## 2023-02-22 ENCOUNTER — Telehealth: Payer: Self-pay | Admitting: Cardiology

## 2023-02-22 MED ORDER — ROSUVASTATIN CALCIUM 10 MG PO TABS: 10.0000 mg | ORAL_TABLET | Freq: Every day | ORAL | 0 refills | Status: DC

## 2023-02-22 NOTE — Telephone Encounter (Signed)
*  STAT* If patient is at the pharmacy, call can be transferred to refill team.   1. Which medications need to be refilled? (please list name of each medication and dose if known)   rosuvastatin (CRESTOR) 10 MG tablet (Expired)    2. Which pharmacy/location (including street and city if local pharmacy) is medication to be sent to? [id number] - [place], [place] - [address]   3. Do they need a 30 day or 90 day supply? 90

## 2023-02-22 NOTE — Telephone Encounter (Signed)
Pt's medication was sent to pt's pharmacy as requested. Confirmation received.

## 2023-03-24 ENCOUNTER — Other Ambulatory Visit: Payer: Self-pay | Admitting: Cardiology

## 2023-03-25 NOTE — Telephone Encounter (Signed)
 1 refill okay too, as currently listed. I will go ahead and sign off.  Thanks MJP

## 2023-03-25 NOTE — Telephone Encounter (Signed)
 Seen by me in 07/2022 with plan for 1 year follow up. Please send 90 day supply X2 refills.  Thanks MJP

## 2023-03-25 NOTE — Telephone Encounter (Signed)
 Dr. Damian Leavell Pt. She has not been seen at [facility] yet. Does she need to be seen before refilling this RX? Please advise.

## 2023-04-12 ENCOUNTER — Emergency Department (HOSPITAL_COMMUNITY)
Admission: EM | Admit: 2023-04-12 | Discharge: 2023-04-12 | Disposition: A | Discharge status: Home / Self Care | Attending: Emergency Medicine | Admitting: Emergency Medicine

## 2023-04-12 ENCOUNTER — Emergency Department (HOSPITAL_COMMUNITY)

## 2023-04-12 ENCOUNTER — Encounter (HOSPITAL_COMMUNITY): Payer: Self-pay

## 2023-04-12 ENCOUNTER — Other Ambulatory Visit: Payer: Self-pay

## 2023-04-12 DIAGNOSIS — W19XXXA Unspecified fall, initial encounter: Secondary | ICD-10-CM

## 2023-04-12 DIAGNOSIS — S0240DA Maxillary fracture, left side, initial encounter for closed fracture: Secondary | ICD-10-CM | POA: Insufficient documentation

## 2023-04-12 DIAGNOSIS — S02401A Maxillary fracture, unspecified, initial encounter for closed fracture: Secondary | ICD-10-CM

## 2023-04-12 DIAGNOSIS — W0110XA Fall on same level from slipping, tripping and stumbling with subsequent striking against unspecified object, initial encounter: Secondary | ICD-10-CM | POA: Insufficient documentation

## 2023-04-12 DIAGNOSIS — Z7982 Long term (current) use of aspirin: Secondary | ICD-10-CM | POA: Diagnosis not present

## 2023-04-12 DIAGNOSIS — S01112A Laceration without foreign body of left eyelid and periocular area, initial encounter: Secondary | ICD-10-CM | POA: Insufficient documentation

## 2023-04-12 DIAGNOSIS — I10 Essential (primary) hypertension: Secondary | ICD-10-CM | POA: Diagnosis not present

## 2023-04-12 DIAGNOSIS — Z79899 Other long term (current) drug therapy: Secondary | ICD-10-CM | POA: Diagnosis not present

## 2023-04-12 DIAGNOSIS — S065XAA Traumatic subdural hemorrhage with loss of consciousness status unknown, initial encounter: Secondary | ICD-10-CM

## 2023-04-12 DIAGNOSIS — S02842A Fracture of lateral orbital wall, left side, initial encounter for closed fracture: Secondary | ICD-10-CM | POA: Diagnosis not present

## 2023-04-12 DIAGNOSIS — M25562 Pain in left knee: Secondary | ICD-10-CM | POA: Diagnosis not present

## 2023-04-12 DIAGNOSIS — S0993XA Unspecified injury of face, initial encounter: Secondary | ICD-10-CM | POA: Diagnosis present

## 2023-04-12 DIAGNOSIS — S0285XA Fracture of orbit, unspecified, initial encounter for closed fracture: Secondary | ICD-10-CM

## 2023-04-12 LAB — CBC WITH DIFFERENTIAL/PLATELET
Abs Immature Granulocytes: 0.02 10*3/uL (ref 0.00–0.07)
Basophils Absolute: 0.1 10*3/uL (ref 0.0–0.1)
Basophils Relative: 1 %
Eosinophils Absolute: 0.3 10*3/uL (ref 0.0–0.5)
Eosinophils Relative: 4 %
HCT: 33.8 % — ABNORMAL LOW (ref 36.0–46.0)
Hemoglobin: 10.7 g/dL — ABNORMAL LOW (ref 12.0–15.0)
Immature Granulocytes: 0 %
Lymphocytes Relative: 25 %
Lymphs Abs: 1.8 10*3/uL (ref 0.7–4.0)
MCH: 29.4 pg (ref 26.0–34.0)
MCHC: 31.7 g/dL (ref 30.0–36.0)
MCV: 92.9 fL (ref 80.0–100.0)
Monocytes Absolute: 0.7 10*3/uL (ref 0.1–1.0)
Monocytes Relative: 10 %
Neutro Abs: 4.5 10*3/uL (ref 1.7–7.7)
Neutrophils Relative %: 60 %
Platelets: 154 10*3/uL (ref 150–400)
RBC: 3.64 MIL/uL — ABNORMAL LOW (ref 3.87–5.11)
RDW: 14.4 % (ref 11.5–15.5)
WBC: 7.4 10*3/uL (ref 4.0–10.5)
nRBC: 0 % (ref 0.0–0.2)

## 2023-04-12 LAB — BASIC METABOLIC PANEL
Anion gap: 8 (ref 5–15)
BUN: 26 mg/dL — ABNORMAL HIGH (ref 8–23)
CO2: 24 mmol/L (ref 22–32)
Calcium: 8.9 mg/dL (ref 8.9–10.3)
Chloride: 107 mmol/L (ref 98–111)
Creatinine, Ser: 0.75 mg/dL (ref 0.44–1.00)
GFR, Estimated: >60 mL/min (ref >60)
Glucose, Bld: 105 mg/dL — ABNORMAL HIGH (ref 70–99)
Potassium: 3.8 mmol/L (ref 3.5–5.1)
Sodium: 139 mmol/L (ref 135–145)

## 2023-04-12 MED ORDER — BACITRACIN ZINC 500 UNIT/GM EX OINT
TOPICAL_OINTMENT | Freq: Two times a day (BID) | CUTANEOUS | Status: DC
  Administered 2023-04-12: 1 via TOPICAL
  Filled 2023-04-12: qty 0.9

## 2023-04-12 MED ORDER — ACETAMINOPHEN 325 MG PO TABS
650.0000 mg | ORAL_TABLET | Freq: Once | ORAL | Status: AC
  Administered 2023-04-12: 650 mg via ORAL
  Filled 2023-04-12: qty 2

## 2023-04-12 NOTE — ED Provider Notes (Signed)
 [place] [facility] AT [facility] Provider Note   CSN: [id number] Arrival date & time: 04/12/23  0043     History  Chief Complaint  Patient presents with   Beth Burke is a 83 y.o. female history of GERD, arthritis, hypertension presented after mechanical fall that occurred at 11 PM.  Patient states she was outside feeding her cat when she got her toe stuck on the raised plan bed and fell and struck the left side of her face.  Patient denies LOC, blood thinners.  Patient states she is swelling around her left eye been still move her eye and see out of her left eye.  Patient denies any pain but does note she has scrapes and cuts to her left upper extremity along to her left hand.  Last tetanus was a year ago.  Patient also states that her left knee hurts but that she has been able to walk and has an abrasion to this.  Patient denies any chest pain, shortness of breath, abdominal pain.  Patient states her nose also hurts and that she initially had a nosebleed and that she was coughing up blood from the nosebleed.  Home Medications Prior to Admission medications   Medication Sig Start Date End Date Taking? Authorizing Provider  aspirin EC 81 MG tablet Take 81 mg by mouth at bedtime.    [provider]  Calcium Carbonate-Vit D-Min (CALTRATE 600+D PLUS PO) Take 1 tablet by mouth daily.    [provider]  Cholecalciferol (VITAMIN D) 2000 UNITS tablet Take 2,000 Units by mouth daily.    [provider]  Coenzyme Q10 (CO Q-10) 100 MG CAPS Take by mouth.    [provider]  Multiple Vitamin (MULTIVITAMIN) tablet Take 1 tablet by mouth daily.    [provider]  Omega-3 Fatty Acids (FISH OIL) 1000 MG CAPS Take 1 capsule by mouth daily.     [provider]  rosuvastatin (CRESTOR) 10 MG tablet TAKE 1 TABLET BY MOUTH EVERYDAY AT BEDTIME 03/25/23   Patwardhan, Manish J, MD  sertraline (ZOLOFT) 100 MG tablet Take 100  mg by mouth daily. 03/02/18   [provider]      Allergies    Cefdinir, Codeine, Escitalopram, Mirtazapine, Oxycodone, Psyllium, and Raloxifene    Review of Systems   Review of Systems  Physical Exam Updated Vital Signs BP (!) 170/76 (BP Location: Right Arm)   Pulse 73   Temp 98.1 F (36.7 C) (Oral)   Resp 18   SpO2 97%  Physical Exam Vitals reviewed.  Constitutional:      General: She is not in acute distress. HENT:     Head: Normocephalic.     Comments: 2 small lacerations approximately 0.5 cm in length closely approximated noted lateral to left eye that appears superficial    Right Ear: Tympanic membrane, ear canal and external ear normal.     Left Ear: Tympanic membrane, ear canal and external ear normal.     Nose:     Comments: Slight deformity to nose with tenderness to the left side No septal hematoma noted No epistaxis noted    Mouth/Throat:     Mouth: Mucous membranes are moist.  Eyes:     Extraocular Movements: Extraocular movements intact.     Conjunctiva/sclera: Conjunctivae normal.     Pupils: Pupils are equal, round, and reactive to light.     Comments: Significant ecchymosis and tenderness around the left eye  No entrapment noted  Cardiovascular:     Rate and Rhythm: Normal rate and regular rhythm.     Pulses: Normal pulses.     Heart sounds: Normal heart sounds.     Comments: 2+ bilateral radial/dorsalis pedis pulses with regular rate Pulmonary:     Effort: Pulmonary effort is normal. No respiratory distress.     Breath sounds: Normal breath sounds.  Abdominal:     Palpations: Abdomen is soft.     Tenderness: There is no abdominal tenderness. There is no guarding or rebound.  Musculoskeletal:        General: Normal range of motion.     Cervical back: Normal range of motion and neck supple. No tenderness.     Comments: 5 out of 5 bilateral grip/knee extension strength No midline tenderness or bony abnormalities palpated Pelvis stable Mild  tenderness to the left patella however no bony abnormalities  Skin:    General: Skin is warm and dry.     Capillary Refill: Capillary refill takes less than 2 seconds.     Comments: Small cuts noted to left upper extremity along into the left hand they are not actively bleeding nor show signs of cellulitis, warmth, fluctuance or any discharge  Neurological:     General: No focal deficit present.     Mental Status: She is alert and oriented to person, place, and time.     Sensory: Sensation is intact.     Motor: Motor function is intact.     Coordination: Coordination is intact.     Gait: Gait is intact.     Comments: Sensation intact in all 4 limbs Cranial nerves III through XII intact Vision grossly intact  Psychiatric:        Mood and Affect: Mood normal.     ED Results / Procedures / Treatments   Labs (all labs ordered are listed, but only abnormal results are displayed) Labs Reviewed  BASIC METABOLIC PANEL - Abnormal; Notable for the following components:      Result Value   Glucose, Bld 105 (*)    BUN 26 (*)    All other components within normal limits  CBC WITH DIFFERENTIAL/PLATELET - Abnormal; Notable for the following components:   RBC 3.64 (*)    Hemoglobin 10.7 (*)    HCT 33.8 (*)    All other components within normal limits    EKG None  Radiology DG Wrist Complete Left Result Date: 04/12/2023 CLINICAL DATA:  Status post fall. EXAM: LEFT WRIST - COMPLETE 3+ VIEW COMPARISON:  None Available. FINDINGS: There is no evidence of an acute fracture or dislocation. A chronic fracture deformity is seen involving the distal aspect of the fifth left metacarpal. Moderate to marked severity degenerative changes are seen throughout the left wrist. Additional degenerative changes are present involving first carpometacarpal joint. Soft tissues are unremarkable. IMPRESSION: 1. Degenerative changes without evidence of an acute osseous abnormality. 2. Chronic fracture deformity of the  distal aspect of the fifth left metacarpal. Electronically Signed   By: Aram Candela M.D.   On: 04/12/2023 02:13   DG Knee Complete 4 Views Left Result Date: 04/12/2023 CLINICAL DATA:  Status post fall EXAM: LEFT KNEE - COMPLETE 4+ VIEW COMPARISON:  None Available. FINDINGS: No acute fracture or dislocation. Mild medial compartment narrowing. Soft tissues are radiographically unremarkable. No knee joint effusion. IMPRESSION: No acute fracture or dislocation. Electronically Signed   By: Minerva Fester M.D.   On: 04/12/2023 02:08   CT Cervical  Spine Wo Contrast Result Date: 04/12/2023 CLINICAL DATA:  Status post trauma. EXAM: CT CERVICAL SPINE WITHOUT CONTRAST TECHNIQUE: Multidetector CT imaging of the cervical spine was performed without intravenous contrast. Multiplanar CT image reconstructions were also generated. RADIATION DOSE REDUCTION: This exam was performed according to the departmental dose-optimization program which includes automated exposure control, adjustment of the mA and/or kV according to patient size and/or use of iterative reconstruction technique. COMPARISON:  None Available. FINDINGS: Alignment: Normal. Skull base and vertebrae: No acute fracture. No primary bone lesion or focal pathologic process. A metallic density fusion plate and screws are seen along the anterior aspect of the C4, C5, C6 and C7 vertebral bodies. Soft tissues and spinal canal: No prevertebral fluid or swelling. No visible canal hematoma. Disc levels: Mild endplate sclerosis is seen at the levels of C2-C3 and C3-C4 with marked severity endplate sclerosis, anterior osteophyte formation and posterior bony spurring noted at the level of C7-T1. Anterior cervical fusion of the C4-C5, C5-C6 and C6-C7 levels is seen. Marked severity intervertebral disc space narrowing is also present at the level of C7-T1. Bilateral marked severity multilevel facet joint hypertrophy is noted. Upper chest: There is evidence of  emphysematous lung disease. Other: There is marked severity left maxillary sinus opacification with acute fracture deformity is seen extending through the floor of the left maxillary sinus. IMPRESSION: 1. No acute fracture or subluxation of the cervical spine. 2. Anterior cervical fusion of the C4-C5, C5-C6 and C6-C7 levels. 3. Marked severity multilevel degenerative changes, as described above. 4. Acute fracture of the floor of the left maxillary sinus. 5. Emphysematous lung disease. Emphysema (ICD10-J43.9). Electronically Signed   By: Aram Candela M.D.   On: 04/12/2023 02:06   CT Maxillofacial WO CM Result Date: 04/12/2023 CLINICAL DATA:  Status post trauma. EXAM: CT MAXILLOFACIAL WITHOUT CONTRAST TECHNIQUE: Multidetector CT imaging of the maxillofacial structures was performed. Multiplanar CT image reconstructions were also generated. RADIATION DOSE REDUCTION: This exam was performed according to the departmental dose-optimization program which includes automated exposure control, adjustment of the mA and/or kV according to patient size and/or use of iterative reconstruction technique. COMPARISON:  None Available. FINDINGS: Osseous: Acute, comminuted fracture deformities are seen involving the anterior wall, posterior wall, medial wall and floor of the left maxillary sinus. This extends to involve the floor of the left orbit. Orbits: Acute comminuted fracture deformity of the floor the left orbit is seen. There is no evidence to suggest left inferior rectus muscle entrapment. A nondisplaced fracture of the lateral wall of the left orbit is also noted. 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 The left globe is intact. Sinuses: There is marked severity hyperdense left maxillary sinus mucosal thickening. Moderate severity left ethmoid sinus, left-sided frontal sinus and left nasal mucosal thickening is also seen. Soft tissues: There is moderate to marked severity left sided facial, lateral  left periorbital, left preseptal and left supra orbital soft tissue swelling. A moderate amount of left facial soft tissue air is also seen. Limited intracranial: A small anterior interhemispheric acute subdural hematoma is seen. IMPRESSION: 1. Acute, comminuted fracture deformities involving the anterior wall, posterior wall, medial wall and floor of the left maxillary sinus. This extends to involve the floor of the left orbit. 2. Nondisplaced fracture of the lateral wall of the left orbit. 3. Moderate to marked severity left-sided facial, lateral left periorbital, left preseptal and left supra orbital soft tissue swelling. 4. Small anterior interhemispheric acute subdural hematoma. Electronically Signed   By: Waylan Rocher  Houston M.D.   On: 04/12/2023 02:03   CT OrbitsS W/O CM Result Date: 04/12/2023 CLINICAL DATA:  Status post trauma. EXAM: CT ORBITS WITHOUT CONTRAST TECHNIQUE: Multidetector CT imaging of the orbits was performed using the standard protocol without intravenous contrast. Multiplanar CT image reconstructions were also generated. RADIATION DOSE REDUCTION: This exam was performed according to the departmental dose-optimization program which includes automated exposure control, adjustment of the mA and/or kV according to patient size and/or use of iterative reconstruction technique. COMPARISON:  None Available. FINDINGS: Orbits: Acute, comminuted fracture deformity is seen involving the floor of the left orbit. There is no evidence of inferior rectus muscle entrapment. A nondisplaced fracture of the lateral wall of the left orbit is also noted. A moderate to marked amount of orbital emphysema is seen. The left globe is intact. Visible paranasal sinuses: There is marked severity hyperdense left maxillary sinus mucosal thickening. Moderate severity left nasal mucosal, left ethmoid sinus and left-sided frontal sinus mucosal thickening is also present. Soft tissues: There is moderate to marked severity  left-sided facial, left lateral periorbital, left preseptal and left supra orbital soft tissue swelling. A moderate amount of left infraorbital facial soft tissue air is also seen. Mild right supra orbital soft tissue swelling is noted. Osseous: Acute, comminuted fracture deformities are seen involving the anterior, posterior, lateral and inferior walls of the left maxillary sinus. This extends to involve the floor of the left orbit. Limited intracranial: The acute anterior inter hemispheric subdural hematoma seen on the plain brain CT is not clearly visualized on the current exam. IMPRESSION: 1. Acute, comminuted fracture deformities involving the anterior, posterior, lateral and inferior walls of the left maxillary sinus. This extends to involve the floor of the left orbit. 2. Acute, nondisplaced fracture of the lateral wall of the left orbit. 3. Moderate to marked severity left-sided facial, left lateral periorbital, left preseptal and left supra orbital soft tissue swelling. 4. 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 5. Moderate severity left nasal mucosal, left ethmoid sinus and left-sided frontal sinus mucosal thickening. 6. The acute anterior inter hemispheric subdural hematoma seen on the plain brain CT is not clearly visualized on the current exam. Electronically Signed   By: Aram Candela M.D.   On: 04/12/2023 02:00   CT Head Wo Contrast Result Date: 04/12/2023 CLINICAL DATA:  Status post trauma. EXAM: CT HEAD WITHOUT CONTRAST TECHNIQUE: Contiguous axial images were obtained from the base of the skull through the vertex without intravenous contrast. RADIATION DOSE REDUCTION: This exam was performed according to the departmental dose-optimization program which includes automated exposure control, adjustment of the mA and/or kV according to patient size and/or use of iterative reconstruction technique. COMPARISON:  None Available. FINDINGS: Brain: There is generalized cerebral  atrophy with widening of the extra-axial spaces and ventricular dilatation. There are areas of decreased attenuation within the white matter tracts of the supratentorial brain, consistent with microvascular disease changes. A mild amount of acute subdural blood is seen along the anterior interhemispheric fissure, to the right of midline (measures approximately 3.6 mm in maximum thickness and 4.3 cm in length). There is no associated mass effect or midline shift. Vascular: No hyperdense vessel or unexpected calcification. Skull: Normal. Negative for fracture or focal lesion. Sinuses/Orbits: There is marked severity left maxillary sinus, left nasal mucosal, left ethmoid sinus and left-sided frontal mucosal thickening. Acute fracture deformities are seen involving the anterior and medial walls of the left maxillary sinus, as well as the floor of the left  orbit. There is a moderate to marked amount of associated left-sided orbital emphysema. The left globe is intact. Other: Moderate severity lateral left periorbital, left preseptal and left supra orbital soft tissue swelling is seen. Moderate severity scalp soft tissue swelling is also noted along the anterior aspect of the vertex. An associated scalp soft tissue hematoma is present. IMPRESSION: 1. Small acute anterior inter hemispheric subdural hematoma, as described above. MRI correlation is recommended. 2. Acute fracture deformities involving the anterior and medial walls of the left maxillary sinus, as well as the floor of the left orbit. 3. Moderate severity lateral left periorbital, left preseptal and left supra orbital soft tissue swelling. 4. Generalized cerebral atrophy and microvascular disease changes of the supratentorial brain. Electronically Signed   By: Aram Candela M.D.   On: 04/12/2023 01:55    Procedures .Critical Care  Performed by: Netta Corrigan, PA-C Authorized by: Netta Corrigan, PA-C   Critical care provider statement:     Critical care time (minutes):  50   Critical care time was exclusive of:  Separately billable procedures and treating other patients   Critical care was necessary to treat or prevent imminent or life-threatening deterioration of the following conditions: Acute subdural hematoma.   Critical care was time spent personally by me on the following activities:  Blood draw for specimens, development of treatment plan with patient or surrogate, discussions with consultants, evaluation of patient's response to treatment, examination of patient, obtaining history from patient or surrogate, review of old charts, re-evaluation of patient's condition, pulse oximetry, ordering and review of radiographic studies, ordering and review of laboratory studies and ordering and performing treatments and interventions   I assumed direction of critical care for this patient from another provider in my specialty: no     Care discussed with comment:  Neurosurgery on-call .Laceration Repair  Date/Time: 04/12/2023 3:39 AM  Performed by: Netta Corrigan, PA-C Authorized by: Netta Corrigan, PA-C   Consent:    Consent obtained:  Verbal   Consent given by:  Patient   Risks, benefits, and alternatives were discussed: yes     Risks discussed:  Infection, need for additional repair, nerve damage, pain, poor wound healing, retained foreign body, poor cosmetic result, vascular damage and tendon damage Universal protocol:    Procedure explained and questions answered to patient or proxy's satisfaction: yes     Imaging studies available: yes     Immediately prior to procedure, a time out was called: yes     Patient identity confirmed:  Verbally with patient Anesthesia:    Anesthesia method:  None Laceration details:    Location:  Face   Face location:  L eyebrow   Length (cm):  0.5   Depth (mm):  1 Treatment:    Area cleansed with:  Saline   Amount of cleaning:  Standard   Irrigation solution:  Sterile saline    Irrigation method:  Pressure wash   Visualized foreign bodies/material removed: no     Debridement:  None Skin repair:    Repair method:  Tissue adhesive Approximation:    Approximation:  Close Repair type:    Repair type:  Simple Post-procedure details:    Dressing:  Open (no dressing)   Procedure completion:  Tolerated .Laceration Repair  Date/Time: 04/12/2023 3:40 AM  Performed by: Netta Corrigan, PA-C Authorized by: Netta Corrigan, PA-C   Consent:    Consent obtained:  Verbal   Consent given by:  Patient  Risks, benefits, and alternatives were discussed: yes     Risks discussed:  Infection, need for additional repair, nerve damage, poor wound healing, poor cosmetic result, pain, retained foreign body, tendon damage and vascular damage Universal protocol:    Procedure explained and questions answered to patient or proxy's satisfaction: yes     Imaging studies available: yes     Immediately prior to procedure, a time out was called: yes     Patient identity confirmed:  Verbally with patient Laceration details:    Location:  Face   Face location:  L eyebrow   Length (cm):  0.5   Depth (mm):  2 Treatment:    Area cleansed with:  Saline   Amount of cleaning:  Standard   Irrigation solution:  Sterile saline   Irrigation method:  Pressure wash   Visualized foreign bodies/material removed: no     Debridement:  None Skin repair:    Repair method:  Tissue adhesive Approximation:    Approximation:  Close Repair type:    Repair type:  Simple Post-procedure details:    Dressing:  Open (no dressing)   Procedure completion:  Tolerated     Medications Ordered in ED Medications  bacitracin ointment (1 Application Topical Given 04/12/23 0202)  acetaminophen (TYLENOL) tablet 650 mg (650 mg Oral Given 04/12/23 0202)    ED Course/ Medical Decision Making/ A&P                                 Medical Decision Making Amount and/or Complexity of Data Reviewed Labs:  ordered. Radiology: ordered.  Risk OTC drugs.   AMEIRAH KHATOON 83 y.o. presented today for fall. Working DDx that I considered at this time includes, but not limited to, vasovagal episode, mechanical fall, ICH, epidural/subdural hematoma, basilar skull fracture, anemia, electrolyte abnormalities, drug-induced, arrhythmia, UTI, fracture, contusion, soft tissue injury.  R/o DDx: vasovagal episode, ICH, epidural hematoma, basilar skull fracture, anemia, electrolyte abnormalities, drug-induced, arrhythmia, UTI: These are considered less likely due to history of present illness, physical exam, lab/imaging findings  Review of prior external notes: 04/01/2023 office visit  Unique Tests and My Independent Interpretation:  CT Head without contrast: Acute subdural hematoma CT maxillofacial: Multiple fractures within the maxillary sinus walls along with lateral orbital fracture CT orbits: Lateral orbital fracture of the left eye Left wrist x-ray: Unremarkable Left knee x-ray: Unremarkable CT Cervical spine without contrast: Unremarkable CBC: Unremarkable BMP: Unremarkable  Social Determinants of Health: none  Discussion with Independent Historian:  None  Discussion of Management of Tests:  Jearld Fenton, MD ENT; Val Eagle, NP Neurosurgery  Risk: Medium: prescription drug management  Risk Stratification Score: None  Staffed with Madilyn Hook, MD  Plan: On exam patient was no acute distress but does have signs of trauma to the face region.  Patient had no entrapment vision was grossly intact and neuroexam was ultimately reassuring however will obtain imaging due to significant ecchymosis of the left eye.  Patient also did have tenderness to the left side of her nose as well with slight deformity noted.  No septal hematoma was noted and so this time do believe patient may have broken her nose as well.  Patient does have other small cuts on the left upper extremity that we will irrigate and place bacitracin  ointment.  Patient's status was a year ago and so we do not need to repeat.  Will give patient Tylenol.  Radiology called  stating that patient is 3.6 mm anterior subdural with no mass effect along with multiple facial fractures.  Will consult neurosurgery for the subdural while we await the rest of patient's CT readings.  Rest of CT imaging shows nondisplaced fracture of the lateral wall of the left orbits along with comminuted fracture involving the anterior wall/posterior wall/medial wall and floor of the left maxillary sinus involving the floor of the left orbit.  Will consult ENT.  I spoke to ENT and they state that patient's fractures can follow-up outpatient.  I then spoke with neurosurgery who recommends repeat CT scan in 6 hours and do not believe he needs an MRI at this time.  Patient and family were updated of this plan.  Patient signed out to [place], PA-C.  Please review their note for the continuation of patient's care.  The plan at this point is follow-up on repeat CT scan and if negative can discharge with outpatient follow-up.  This chart was dictated using voice recognition software.  Despite best efforts to proofread,  errors can occur which can change the documentation meaning.         Final Clinical Impression(s) / ED Diagnoses Final diagnoses:  Fall, initial encounter  Subdural hematoma (HCC)  Closed fracture of maxillary sinus, initial encounter [facility])  Closed fracture of orbit, initial encounter [facility])    Rx / DC Orders ED Discharge Orders     None         Remi Deter 04/12/23 0606    Tilden Fossa, MD 04/12/23 [phone]

## 2023-04-12 NOTE — ED Triage Notes (Signed)
 Pt. Arrives POV for a mechanical fall. Hit her head. Bruising noted to the L. Eye and swelling noted to the left side of her face. Endorses epistaxis. Also has bruising to the L. Forearm. C/o knee pain as well. Denies blood thinners. Alert and oriented x4.

## 2023-04-12 NOTE — ED Provider Notes (Signed)
 Care assumed from [facility] PA-C at shift change. Please see their note for further information.   Briefly: Patient with mechanical fall not on thinners. CT shows facial fractures with subdural hematoma. No neurologic deficits. Neurosurgery consulted, plan for repeat CT at 8 am. ENT consulted, plan for outpatient follow-up  8:00AM: Repeat CT obtained and has resulted and reveals  No progression of inter hemispheric subdural hematoma measuring up to 4 mm in thickness. No progression of subarachnoid hemorrhage at the low right sylvian fissure.  I have personally reviewed and interpreted this imaging and agree with radiology interpretation.   Patient reassessed and is having some pain but is feeling overall well. Plan per previous providers recommendations, d/c with ENT and neurosurgery follow-up with return precautions.   Evaluation and diagnostic testing in the emergency department does not suggest an emergent condition requiring admission or immediate intervention beyond what has been performed at this time.  Plan for discharge with close PCP follow-up.  Patient is understanding and amenable with plan, educated on red flag symptoms that would prompt immediate return.  Patient discharged in stable condition.    Vear Clock 04/12/23 7829    Derwood Kaplan, MD 04/14/23 1130

## 2023-04-12 NOTE — Discharge Instructions (Addendum)
 Please follow-up with the ENT specialist and neurosurgeon in regards recent ER visit.  Today your labs and imaging show that you have a small bleed in your brain that appears stable after repeat imaging along with multiple fractures in your maxillary sinus on the left side.  You may take Tylenol every 6 hours needed for pain along with ice the area.  If symptoms change or worsen please return to the ER.

## 2023-04-15 ENCOUNTER — Encounter (INDEPENDENT_AMBULATORY_CARE_PROVIDER_SITE_OTHER): Payer: Self-pay | Admitting: Otolaryngology

## 2023-04-15 ENCOUNTER — Ambulatory Visit (INDEPENDENT_AMBULATORY_CARE_PROVIDER_SITE_OTHER): Admitting: Otolaryngology

## 2023-04-15 VITALS — BP 138/77 | HR 106 | Ht 62.0 in | Wt 127.0 lb

## 2023-04-15 DIAGNOSIS — R042 Hemoptysis: Secondary | ICD-10-CM

## 2023-04-15 DIAGNOSIS — S0285XA Fracture of orbit, unspecified, initial encounter for closed fracture: Secondary | ICD-10-CM

## 2023-04-15 DIAGNOSIS — R220 Localized swelling, mass and lump, head: Secondary | ICD-10-CM | POA: Diagnosis not present

## 2023-04-15 DIAGNOSIS — H538 Other visual disturbances: Secondary | ICD-10-CM

## 2023-04-15 DIAGNOSIS — W19XXXA Unspecified fall, initial encounter: Secondary | ICD-10-CM

## 2023-04-15 DIAGNOSIS — S0993XA Unspecified injury of face, initial encounter: Secondary | ICD-10-CM

## 2023-04-15 DIAGNOSIS — R040 Epistaxis: Secondary | ICD-10-CM | POA: Diagnosis not present

## 2023-04-15 DIAGNOSIS — S0240DA Maxillary fracture, left side, initial encounter for closed fracture: Secondary | ICD-10-CM | POA: Diagnosis not present

## 2023-04-15 MED ORDER — SALINE SPRAY 0.65 % NA SOLN: 1.0000 | NASAL | 5 refills | Status: AC | PRN

## 2023-04-15 NOTE — Patient Instructions (Signed)
-   see you PCP regarding head trauma and memory problems/brain fog, you might a concussion  - see Ophthalmology ASAP - call today to have a dilated eye exam - try Arnica products for bruising  - don't use a straw, sneeze with open mouth, and try to not forcefully clear your nose/blow your nose  - try nasal saline sprays both nostrils to help clear clot from the left nostril - it is the source

## 2023-04-15 NOTE — Progress Notes (Addendum)
 ENT CONSULT:  Reason for Consult: mechanical fall facial trauma    HPI: Discussed the use of AI scribe software for clinical note transcription with the patient, who gave verbal consent to proceed.  Discussed the use of AI scribe software for clinical note transcription with the patient, who gave verbal consent to proceed.  History of Present Illness   Beth Burke is an 83 year old female who presents with facial fractures following a fall 04/12/23.  She experienced a fall on Sunday night, resulting in facial trauma after tripping over a patio step while feeding a feral cat. Since the fall, she has blurry vision and swelling around her left eye, which she is unable to fully open due to bruising and swelling. She has difficulty with concentration and memory, which she attributes to the fall. No double vision.  She has undergone CT scans in ED, revealing a small subdural hematoma and nondisplaced facial fractures, including an orbital fracture and left maxillary sinus fractures. Swelling and bruising are noted in the orbit and scalp. She reports spitting up blood tinged secretions but had no anterior epistaxis.  She is not on blood thinners but takes 81 mg of aspirin. For pain management, she has been using Tylenol, taking two tablets last night. She has not seen her primary care doctor since the fall but plans to do so soon. She did not have eye exam, was not seen by Ophthalmology.  Records Reviewed:  ED note 04/12/23 On exam patient was no acute distress but does have signs of trauma to the face region.  Patient had no entrapment vision was grossly intact and neuroexam was ultimately reassuring however will obtain imaging due to significant ecchymosis of the left eye.  Patient also did have tenderness to the left side of her nose as well with slight deformity noted.  No septal hematoma was noted and so this time do believe patient may have broken her nose as well.  Patient does have other small  cuts on the left upper extremity that we will irrigate and place bacitracin ointment.  Patient's status was a year ago and so we do not need to repeat.  Will give patient Tylenol.   Radiology called stating that patient is 3.6 mm anterior subdural with no mass effect along with multiple facial fractures.  Will consult neurosurgery for the subdural while we await the rest of patient's CT readings.  Rest of CT imaging shows nondisplaced fracture of the lateral wall of the left orbits along with comminuted fracture involving the anterior wall/posterior wall/medial wall and floor of the left maxillary sinus involving the floor of the left orbit.  Will consult ENT.   I spoke to ENT and they state that patient's fractures can follow-up outpatient.  I then spoke with neurosurgery who recommends repeat CT scan in 6 hours and do not believe he needs an MRI at this time.  Patient and family were updated of this plan   Past Medical History:  Diagnosis Date   Anxiety    Arthritis    Blood transfusion    WITH KNEE REPLACEMENT 01/2011   Elevated cholesterol    GERD (gastroesophageal reflux disease)    Heart murmur    Hypertension    Scarlet fever as child   Sleep apnea    mild sleep apnea, no cpap used   Urinary incontinence    wears pads   Varicose veins    Vertigo     Past Surgical History:  Procedure Laterality  Date   ABDOMINAL HYSTERECTOMY  01/26/1982   CERVICAL FUSION  04/27/2010   C5 to C7 with plates--PT HAS LIMITED ROM AND PAIN   COLONOSCOPY WITH ESOPHAGOGASTRODUODENOSCOPY (EGD)     KNEE CLOSED REDUCTION  05/22/2011   Procedure: CLOSED MANIPULATION KNEE;  Surgeon: Loanne Drilling, MD;  Location: [facility];  Service: Orthopedics;  Laterality: Right;   right lower leg surgery  01/27/1960   wire placed for crushed leg    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 She has never used smokeless tobacco. She reports that she does not drink alcohol and does not use drugs.  Allergies:  Allergies  Allergen Reactions   Cefdinir Other (See Comments)   Codeine Nausea And Vomiting and Other (See Comments)   Escitalopram     Other Reaction(s): bad reaction to it , made her worse.   Mirtazapine     Other Reaction(s): after two days no help with sleep so stopped it   Oxycodone Nausea And Vomiting   Psyllium     Other Reaction(s): caused gerd   Raloxifene Other (See Comments)    Medications: I have reviewed the patient's current medications.  The PMH, PSH, Medications, Allergies, and SH were reviewed and updated.  ROS: Constitutional: Negative for fever, weight loss and weight gain. Cardiovascular: Negative for chest pain and dyspnea on exertion. Respiratory: Is not experiencing shortness of breath at rest. Gastrointestinal: Negative for nausea and vomiting. Neurological: Negative for headaches. Psychiatric: The patient is not nervous/anxious  Blood pressure 138/77, pulse (!) 106, height 5\' 2"  (1.575 m), weight 127 lb (57.6 kg), SpO2 96%. Body mass index is 23.23 kg/m.  PHYSICAL EXAM:  Exam: General: Well-developed, well-nourished Communication and Voice: Clear pitch and clarity Respiratory Respiratory effort: Equal inspiration and expiration without stridor Cardiovascular Peripheral Vascular: Warm extremities with equal color/perfusion Eyes: No nystagmus with equal extraocular motion bilaterally Neuro/Psych/Balance: Patient  oriented to person, place, and time; Appropriate mood and affect; Gait is intact with no imbalance; Cranial nerves I-XII are intact Head and Face Inspection: Normocephalic and atraumatic without mass or lesion Palpation: Facial skeleton intact without bony stepoffs, severe ecchymosis and swelling around left eye/periorbital area Salivary Glands: No mass or tenderness Facial Strength: Facial motility symmetric and full bilaterally ENT Pinna: External ear intact and fully developed External canal: Canal is patent with intact skin Tympanic Membrane: Clear and mobile External Nose: No scar or anatomic deformity Internal Nose: Septum is slightly deviated to the left, blood clot in middle meatus and streak of old blood noted along soft palate and nasopharynx. No active epistaxis. No polyp, or purulence. Mucosal edema and erythema present.  Bilateral inferior turbinate hypertrophy.  Lips, Teeth, and gums: Mucosa and teeth intact and viable, appear to be in baseline occlusion no loose teeth TMJ: No  pain to palpation with full mobility Oral cavity/oropharynx: No erythema or exudate, no lesions present Nasopharynx: No mass or lesion with intact mucosa old blood along nasopharynx no active posterior epistaxis or bleeding noted  Hypopharynx: Intact mucosa without pooling of secretions Larynx Glottic: Full true vocal cord mobility without lesion or mass Supraglottic: Normal appearing epiglottis and AE folds Interarytenoid Space: Moderate pachydermia&edema Subglottic Space: Patent without lesion or edema Neck Neck and Trachea: Midline trachea without mass or lesion ecchymosis along the neck  Thyroid: No mass or nodularity Lymphatics: No lymphadenopathy  Vertex with palpable hematoma 2 cm in diameter, nontender  Procedure: Preoperative diagnosis: fall, facial trauma, coughing up blood   Postoperative diagnosis:   Same   Procedure: Flexible fiberoptic laryngoscopy  Surgeon: Ashok Croon,  MD  Anesthesia: Topical lidocaine and Afrin Complications: None Condition is stable throughout exam  Indications and consent:  The patient presents to the clinic with above symptoms. Indirect laryngoscopy view was incomplete. Thus it was recommended that they undergo a flexible fiberoptic laryngoscopy. All of the risks, benefits, and potential complications were reviewed with the patient preoperatively and verbal informed consent was obtained.  Procedure: The patient was seated upright in the clinic. Topical lidocaine and Afrin were applied to the nasal cavity. After adequate anesthesia had occurred, I then proceeded to pass the flexible telescope into the nasal cavity. The nasal cavity was patent without rhinorrhea or polyp. The nasopharynx was also patent without mass or lesion. The base of tongue was visualized and was normal. There were no signs of pooling of secretions in the piriform sinuses. The true vocal folds were mobile bilaterally. There were no signs of glottic or supraglottic mucosal lesion or mass. There was moderate interarytenoid pachydermia and post cricoid edema. The telescope was then slowly withdrawn and the patient tolerated the procedure throughout.    PROCEDURE NOTE: nasal endoscopy  Preoperative diagnosis: fall facial trauma suspected epistaxis   Postoperative diagnosis: same  Procedure: Diagnostic nasal endoscopy (03474)  Surgeon: Ashok Croon, M.D.  Anesthesia: Topical lidocaine and Afrin  H&P REVIEW: The patient's history and physical were reviewed today prior to procedure. All medications were reviewed and updated as well. Complications: None Condition is stable throughout exam Indications and consent: The patient presents with symptoms of chronic sinusitis not responding to previous therapies. All the risks, benefits, and potential complications were reviewed with the patient preoperatively and informed consent was obtained. The time out was completed with  confirmation of the correct procedure.   Procedure: The patient was seated upright in the clinic. Topical lidocaine and Afrin were applied to the nasal cavity. After adequate anesthesia had occurred, the rigid nasal endoscope was passed into the nasal cavity. The nasal mucosa, turbinates, septum, and sinus drainage pathways were visualized bilaterally. This revealed no purulence but there was evidence of old blood clot in the left middle meatus, which appeared to be the source of hemoptysis (streak of blood was visualized along posterior soft palate in nasopharynx). There were no polyps or sites of significant inflammation. The mucosa was intact and there was no crusting present. The scope was then slowly withdrawn and the patient tolerated the procedure well. There were no complications or blood loss.   Studies Reviewed: CT max/face 04/12/23 FINDINGS: Osseous: Acute, comminuted fracture deformities are seen involving the anterior wall, posterior wall, medial wall and floor of the left maxillary sinus. This extends to involve the floor of the left orbit.   Orbits: Acute comminuted fracture deformity of the floor the left  orbit is seen. There is no evidence to suggest left inferior rectus muscle entrapment. A nondisplaced fracture of the lateral wall of the left orbit is also noted. 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 The left globe is intact.   Sinuses: There is marked severity hyperdense left maxillary sinus mucosal thickening. Moderate severity left ethmoid sinus, left-sided frontal sinus and left nasal mucosal thickening is also seen.   Soft tissues: There is moderate to marked severity left sided facial, lateral left periorbital, left preseptal and left supra orbital soft tissue swelling. A moderate amount of left facial soft tissue air is also seen.   Limited intracranial: A small anterior interhemispheric acute subdural hematoma is seen.   IMPRESSION: 1. Acute,  comminuted fracture deformities involving the anterior wall, posterior wall, medial wall and floor of the left maxillary sinus. This extends to involve the floor of the left orbit. 2. Nondisplaced fracture of the lateral wall of the left orbit. 3. Moderate to marked severity left-sided facial, lateral left periorbital, left preseptal and left supra orbital soft tissue swelling. 4. Small anterior interhemispheric acute subdural hematoma.    CT orbit 04/12/23  Orbits: Acute, comminuted fracture deformity is seen involving the floor of the left orbit. There is no evidence of inferior rectus muscle entrapment. A nondisplaced fracture of the lateral wall of the left orbit is also noted. A moderate to marked amount of orbital emphysema is seen. The left globe is intact.   Visible paranasal sinuses: There is marked severity hyperdense left maxillary sinus mucosal thickening. Moderate severity left nasal mucosal, left ethmoid sinus and left-sided frontal sinus mucosal thickening is also present.   Soft tissues: There is moderate to marked severity left-sided facial, left lateral periorbital, left preseptal and left supra orbital soft tissue swelling. A moderate amount of left infraorbital facial soft tissue air is also seen. Mild right supra orbital soft tissue swelling is noted.   Osseous: Acute, comminuted fracture deformities are seen involving the anterior, posterior, lateral and inferior walls of the left maxillary sinus. This extends to involve the floor of the left orbit.   Limited intracranial: The acute anterior inter hemispheric subdural hematoma seen on the plain brain CT is not clearly visualized on the current exam.   IMPRESSION: 1. Acute, comminuted fracture deformities involving the anterior, posterior, lateral and inferior walls of the left maxillary sinus. This extends to involve the floor of the left orbit. 2. Acute, nondisplaced fracture of the lateral wall of the  left orbit. 3. Moderate to marked severity left-sided facial, left lateral periorbital, left preseptal and left supra orbital soft tissue swelling. 4. 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 5. Moderate severity left nasal mucosal, left ethmoid sinus and left-sided frontal sinus mucosal thickening. 6. The acute anterior inter hemispheric subdural hematoma seen on the plain brain CT is not clearly visualized on the current exam.    Assessment/Plan: Encounter Diagnoses  Name Primary?   Blurry vision, left eye    Fall, initial encounter Yes   Closed fracture of orbit, initial encounter (HCC)    Closed fracture of left side of maxilla, initial encounter (HCC)    Epistaxis    Facial injury, initial encounter    Hemoptysis     Assessment and Plan    Facial fractures following a mechanical fall 3 days ago Nondisplaced facial fractures from a fall, including a small fracture along the orbital floor and lateral orbital wall, nondisplaced fractures of the left maxillary sinus which appears to  be filled with blood products based on scan and exam.  I reviewed extensive imaging which was performed in the ED including CT orbit CT max face, and based on appearance of the fractures which are nondisplaced no surgical intervention is warranted at this time.  The patient has significant soft tissue swelling on exam and imaging.  She also has significant ecchymosis.  She is complaining of blurry vision on the left eye and I discussed with her that based on the scan and evidence of significant swelling as well as air within the orbit she needs to see ophthalmology for a dilated eye exam  She reports blood-tinged sputum and I believe based on the scope exam today which included bilateral nasal endoscopy and flexible laryngoscopy that the source of blood is retained blood products from the left maxillary sinus related to her fall and facial injury.  There was no evidence of active epistaxis  or septal hematoma.  Flexible laryngoscopy without evidence of bleeding blood or clot.   - Refer to ophthalmology for a dilated eye exam to assess for any traumatic damage  -No surgical intervention warranted based on appearance of fractures on the scan (nondisplaced) and overall reassuring exam - Recommend Arnica products for bruising. - Advised sinus precautions: avoid using a straw, sneeze with an open mouth, and avoid forcefully clearing or blowing the nose. - Recommend nasal saline sprays to help loosen clots and clear blood from the right nasal passage   Blurry vision Blurry vision in the left eye post-fall, likely due to swelling and/or potential orbital trauma. Ophthalmology consultation is necessary to evaluate further.  - Refer to ophthalmology for a dilated eye exam to assess for any traumatic damage inside the globe.  Epistaxis Blood-tinged secretions in the throat, likely from blood products in the left maxillary sinus due to facial trauma. Expected to clear over time.  - Advised on sinus precautions: avoid using a straw, sneeze with an open mouth, and avoid forcefully clearing or blowing the nose. - Recommend nasal saline sprays to help loosen clots and clear blood from the sinus.  Scalp swelling/hematoma Swelling on the scalp/vertex, 2 cm, likely from the fall, sore but not severely painful. Expected to improve with time and home care. - Apply ice to the affected area to reduce swelling.  Subdural hematoma Small subdural hematoma noted on the scan, unrelated to scalp swelling. Not the primary focus of this visit. - Advised follow-up with primary care provider for further evaluation and management.  Memory problems Difficulty concentrating and potential memory issues post-fall, suggesting a concussion. Further evaluation needed by primary care provider. - Advised follow-up with primary care provider for further evaluation and management.  Fall risk Fall occurred while  feeding a feral cat at night, leading to facial trauma. Advised to avoid similar situations to prevent future falls. - Advised against going outside at night to feed the cat to reduce fall risk.  Pain management Pain managed with acetaminophen. Caution advised to avoid overuse. - Continue using acetaminophen for pain management as needed, but avoid overuse.    Thank you for allowing me to participate in the care of this patient. Please do not hesitate to contact me with any questions or concerns.   Ashok Croon, MD Otolaryngology [facility] Phone: [phone] Fax: [phone]    04/15/2023, 6:26 PM

## 2023-04-16 ENCOUNTER — Encounter (INDEPENDENT_AMBULATORY_CARE_PROVIDER_SITE_OTHER): Payer: Self-pay

## 2023-04-26 ENCOUNTER — Telehealth: Payer: Self-pay | Admitting: Cardiology

## 2023-04-26 MED ORDER — ROSUVASTATIN CALCIUM 10 MG PO TABS: 10.0000 mg | ORAL_TABLET | Freq: Every day | ORAL | 1 refills | Status: DC

## 2023-04-26 NOTE — Telephone Encounter (Signed)
 Pt's medication was sent to pt's pharmacy as requested. Confirmation received.

## 2023-04-26 NOTE — Telephone Encounter (Signed)
*  STAT* If patient is at the pharmacy, call can be transferred to refill team.   1. Which medications need to be refilled? (please list name of each medication and dose if known)   rosuvastatin (CRESTOR) 10 MG tablet     2. Would you like to learn more about the convenience, safety, & potential cost savings by using the [facility]? No   3. Are you open to using the [facility] (Type [facility].  ). No   4. Which pharmacy/location (including street and city if local pharmacy) is medication to be sent to? [id number] - [place], [place] - [address]    5. Do they need a 30 day or 90 day supply? 90 day

## 2023-05-05 ENCOUNTER — Ambulatory Visit (INDEPENDENT_AMBULATORY_CARE_PROVIDER_SITE_OTHER): Admitting: Otolaryngology

## 2023-05-05 ENCOUNTER — Encounter (INDEPENDENT_AMBULATORY_CARE_PROVIDER_SITE_OTHER): Payer: Self-pay | Admitting: Otolaryngology

## 2023-05-05 VITALS — BP 133/77 | HR 68

## 2023-05-05 DIAGNOSIS — S0285XA Fracture of orbit, unspecified, initial encounter for closed fracture: Secondary | ICD-10-CM

## 2023-05-05 DIAGNOSIS — R040 Epistaxis: Secondary | ICD-10-CM | POA: Diagnosis not present

## 2023-05-05 DIAGNOSIS — S0240DA Maxillary fracture, left side, initial encounter for closed fracture: Secondary | ICD-10-CM

## 2023-05-05 DIAGNOSIS — R519 Headache, unspecified: Secondary | ICD-10-CM

## 2023-05-05 DIAGNOSIS — S0993XA Unspecified injury of face, initial encounter: Secondary | ICD-10-CM

## 2023-05-05 NOTE — Progress Notes (Unsigned)
 ENT Progress Note:   Update 05/05/2023  Discussed the use of AI scribe software for clinical note transcription with the patient, who gave verbal consent to proceed.  History of Present Illness       Records Reviewed:  Initial Evaluation  Reason for Consult: mechanical fall facial trauma    HPI: Discussed the use of AI scribe software for clinical note transcription with the patient, who gave verbal consent to proceed.  Discussed the use of AI scribe software for clinical note transcription with the patient, who gave verbal consent to proceed.  History of Present Illness   Beth Burke is an 83 year old female who presents with facial fractures following a fall 04/12/23.  She experienced a fall on Sunday night, resulting in facial trauma after tripping over a patio step while feeding a feral cat. Since the fall, she has blurry vision and swelling around her left eye, which she is unable to fully open due to bruising and swelling. She has difficulty with concentration and memory, which she attributes to the fall. No double vision.  She has undergone CT scans in ED, revealing a small subdural hematoma and nondisplaced facial fractures, including an orbital fracture and left maxillary sinus fractures. Swelling and bruising are noted in the orbit and scalp. She reports spitting up blood tinged secretions but had no anterior epistaxis.  She is not on blood thinners but takes 81 mg of aspirin. For pain management, she has been using Tylenol, taking two tablets last night. She has not seen her primary care doctor since the fall but plans to do so soon. She did not have eye exam, was not seen by Ophthalmology.  Records Reviewed:  ED note 04/12/23 On exam patient was no acute distress but does have signs of trauma to the face region.  Patient had no entrapment vision was grossly intact and neuroexam was ultimately reassuring however will obtain imaging due to significant ecchymosis of the left  eye.  Patient also did have tenderness to the left side of her nose as well with slight deformity noted.  No septal hematoma was noted and so this time do believe patient may have broken her nose as well.  Patient does have other small cuts on the left upper extremity that we will irrigate and place bacitracin ointment.  Patient's status was a year ago and so we do not need to repeat.  Will give patient Tylenol.   Radiology called stating that patient is 3.6 mm anterior subdural with no mass effect along with multiple facial fractures.  Will consult neurosurgery for the subdural while we await the rest of patient's CT readings.  Rest of CT imaging shows nondisplaced fracture of the lateral wall of the left orbits along with comminuted fracture involving the anterior wall/posterior wall/medial wall and floor of the left maxillary sinus involving the floor of the left orbit.  Will consult ENT.   I spoke to ENT and they state that patient's fractures can follow-up outpatient.  I then spoke with neurosurgery who recommends repeat CT scan in 6 hours and do not believe he needs an MRI at this time.  Patient and family were updated of this plan   Past Medical History:  Diagnosis Date   Anxiety    Arthritis    Blood transfusion    WITH KNEE REPLACEMENT 01/2011   Elevated cholesterol    GERD (gastroesophageal reflux disease)    Heart murmur    Hypertension    Scarlet fever  as child   Sleep apnea    mild sleep apnea, no cpap used   Urinary incontinence    wears pads   Varicose veins    Vertigo     Past Surgical History:  Procedure Laterality Date   ABDOMINAL HYSTERECTOMY  01/26/1982   CERVICAL FUSION  04/27/2010   C5 to C7 with plates--PT HAS LIMITED ROM AND PAIN   COLONOSCOPY WITH ESOPHAGOGASTRODUODENOSCOPY (EGD)     KNEE CLOSED REDUCTION  05/22/2011   Procedure: CLOSED MANIPULATION KNEE;  Surgeon: Loanne Drilling, MD;  Location: [facility];  Service: Orthopedics;  Laterality: Right;   right lower  leg surgery  01/27/1960   wire placed for crushed leg    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 She has never used smokeless tobacco. She reports that she does not drink alcohol and does not use drugs.  Allergies:  Allergies  Allergen Reactions   Cefdinir Other (See Comments)   Codeine Nausea And Vomiting and Other (See Comments)   Escitalopram     Other Reaction(s): bad reaction to it , made her worse.   Mirtazapine     Other Reaction(s): after two days no help with sleep so stopped it   Oxycodone Nausea And Vomiting   Psyllium     Other Reaction(s): caused gerd   Raloxifene Other (See Comments)    Medications: I have reviewed the patient's current medications.  The PMH, PSH, Medications, Allergies, and SH were reviewed and updated.  ROS: Constitutional: Negative for fever, weight loss and weight gain. Cardiovascular: Negative for chest pain and dyspnea on exertion. Respiratory: Is not experiencing shortness of breath at rest. Gastrointestinal: Negative for nausea and vomiting. Neurological: Negative for headaches. Psychiatric: The patient is not nervous/anxious  Blood pressure 133/77, pulse 68, SpO2 96%. There is no height or weight on file to calculate BMI.  PHYSICAL EXAM:  Exam: General: Well-developed, well-nourished Communication and Voice: Clear pitch and clarity Respiratory Respiratory effort: Equal inspiration and expiration  without stridor Cardiovascular Peripheral Vascular: Warm extremities with equal color/perfusion Eyes: No nystagmus with equal extraocular motion bilaterally Neuro/Psych/Balance: Patient oriented to person, place, and time; Appropriate mood and affect; Gait is intact with no imbalance; Cranial nerves I-XII are intact Head and Face Inspection: Normocephalic and atraumatic without mass or lesion Palpation: Facial skeleton intact without bony stepoffs, severe ecchymosis and swelling around left eye/periorbital area Salivary Glands: No mass or tenderness Facial Strength: Facial motility symmetric and full bilaterally ENT Pinna: External ear intact and fully developed External canal: Canal is patent with intact skin Tympanic Membrane: Clear and mobile External Nose: No scar or anatomic deformity Internal Nose: Septum is slightly deviated to the left, blood clot in middle meatus and streak of old blood noted along soft palate and nasopharynx. No active epistaxis.  No polyp, or purulence. Mucosal edema and erythema present.  Bilateral inferior turbinate hypertrophy.  Lips, Teeth, and gums: Mucosa and teeth intact and viable, appear to be in baseline occlusion no loose teeth TMJ: No pain to palpation with full mobility Oral cavity/oropharynx: No erythema or exudate, no lesions present Nasopharynx: No mass or lesion with intact mucosa old blood along nasopharynx no active posterior epistaxis or bleeding noted  Hypopharynx: Intact mucosa without pooling of secretions Larynx Glottic: Full true vocal cord mobility without lesion or mass Supraglottic: Normal appearing epiglottis and AE folds Interarytenoid Space: Moderate pachydermia&edema Subglottic Space: Patent without lesion or edema Neck Neck and Trachea: Midline trachea without mass or lesion ecchymosis along the neck  Thyroid: No mass or nodularity Lymphatics: No lymphadenopathy  Vertex with palpable hematoma 2 cm in diameter,  nontender  Procedure: Preoperative diagnosis: fall, facial trauma, coughing up blood   Postoperative diagnosis:   Same   Procedure: Flexible fiberoptic laryngoscopy  Surgeon: Ashok Croon, MD  Anesthesia: Topical lidocaine and Afrin Complications: None Condition is stable throughout exam  Indications and consent:  The patient presents to the clinic with above symptoms. Indirect laryngoscopy view was incomplete. Thus it was recommended that they undergo a flexible fiberoptic laryngoscopy. All of the risks, benefits, and potential complications were reviewed with the patient preoperatively and verbal informed consent was obtained.  Procedure: The patient was seated upright in the clinic. Topical lidocaine and Afrin were applied to the nasal cavity. After adequate anesthesia had occurred, I then proceeded to pass the flexible telescope into the nasal cavity. The nasal cavity was patent without rhinorrhea or polyp. The nasopharynx was also patent without mass or lesion. The base of tongue was visualized and was normal. There were no signs of pooling of secretions in the piriform sinuses. The true vocal folds were mobile bilaterally. There were no signs of glottic or supraglottic mucosal lesion or mass. There was moderate interarytenoid pachydermia and post cricoid edema. The telescope was then slowly withdrawn and the patient tolerated the procedure throughout.    PROCEDURE NOTE: nasal endoscopy  Preoperative diagnosis: fall facial trauma suspected epistaxis   Postoperative diagnosis: same  Procedure: Diagnostic nasal endoscopy (56213)  Surgeon: Ashok Croon, M.D.  Anesthesia: Topical lidocaine and Afrin  H&P REVIEW: The patient's history and physical were reviewed today prior to procedure. All medications were reviewed and updated as well. Complications: None Condition is stable throughout exam Indications and consent: The patient presents with symptoms of chronic sinusitis not  responding to previous therapies. All the risks, benefits, and potential complications were reviewed with the patient preoperatively and informed consent was obtained. The time out was completed with confirmation of the correct procedure.   Procedure: The patient was seated upright in the clinic. Topical lidocaine and Afrin were applied to the nasal cavity. After adequate anesthesia had occurred, the rigid nasal endoscope was passed into the nasal cavity. The nasal mucosa, turbinates, septum, and sinus drainage pathways were visualized bilaterally. This revealed no purulence but there was evidence of old blood clot in the left middle meatus, which appeared to be the source of hemoptysis (streak of blood was visualized along posterior soft palate in nasopharynx). There were no polyps or sites of significant inflammation. The mucosa was intact and there was no crusting present. The scope was then slowly withdrawn and the patient tolerated the procedure well. There were no complications or blood loss.   Studies Reviewed: CT max/face 04/12/23 FINDINGS: Osseous: Acute, comminuted fracture deformities are seen involving  the anterior wall, posterior wall, medial wall and floor of the left maxillary sinus. This extends to involve the floor of the left orbit.   Orbits: Acute comminuted fracture deformity of the floor the left orbit is seen. There is no evidence to suggest left inferior rectus muscle entrapment. A nondisplaced fracture of the lateral wall of the left orbit is also noted. 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 The left globe is intact.   Sinuses: There is marked severity hyperdense left maxillary sinus mucosal thickening. Moderate severity left ethmoid sinus, left-sided frontal sinus and left nasal mucosal thickening is also seen.   Soft tissues: There is moderate to marked severity left sided facial, lateral left periorbital, left preseptal and left supra orbital soft  tissue swelling. A moderate amount of left facial soft tissue air is also seen.   Limited intracranial: A small anterior interhemispheric acute subdural hematoma is seen.   IMPRESSION: 1. Acute, comminuted fracture deformities involving the anterior wall, posterior wall, medial wall and floor of the left maxillary sinus. This extends to involve the floor of the left orbit. 2. Nondisplaced fracture of the lateral wall of the left orbit. 3. Moderate to marked severity left-sided facial, lateral left periorbital, left preseptal and left supra orbital soft tissue swelling. 4. Small anterior interhemispheric acute subdural hematoma.    CT orbit 04/12/23  Orbits: Acute, comminuted fracture deformity is seen involving the floor of the left orbit. There is no evidence of inferior rectus muscle entrapment. A nondisplaced fracture of the lateral wall of the left orbit is also noted. A moderate to marked amount of orbital emphysema is seen. The left globe is intact.   Visible paranasal sinuses: There is marked severity hyperdense left maxillary sinus mucosal thickening. Moderate severity left nasal mucosal, left ethmoid sinus and left-sided frontal sinus mucosal thickening is also present.   Soft tissues: There is moderate to marked severity left-sided facial, left lateral periorbital, left preseptal and left supra orbital soft tissue swelling. A moderate amount of left infraorbital facial soft tissue air is also seen. Mild right supra orbital soft tissue swelling is noted.   Osseous: Acute, comminuted fracture deformities are seen involving the anterior, posterior, lateral and inferior walls of the left maxillary sinus. This extends to involve the floor of the left orbit.   Limited intracranial: The acute anterior inter hemispheric subdural hematoma seen on the plain brain CT is not clearly visualized on the current exam.   IMPRESSION: 1. Acute, comminuted fracture deformities  involving the anterior, posterior, lateral and inferior walls of the left maxillary sinus. This extends to involve the floor of the left orbit. 2. Acute, nondisplaced fracture of the lateral wall of the left orbit. 3. Moderate to marked severity left-sided facial, left lateral periorbital, left preseptal and left supra orbital soft tissue swelling. 4. 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 5. Moderate severity left nasal mucosal, left ethmoid sinus and left-sided frontal sinus mucosal thickening. 6. The acute anterior inter hemispheric subdural hematoma seen on the plain brain CT is not clearly visualized on the current exam.  CT head 04/12/23 IMPRESSION: No progression of inter hemispheric subdural hematoma measuring up to 4 mm in thickness. No progression of subarachnoid hemorrhage at the low right sylvian fissure.    Assessment/Plan: No diagnosis found.   Assessment and Plan    Facial fractures following a mechanical fall 3 days ago Nondisplaced facial fractures from a fall, including a small fracture along the orbital floor and lateral  orbital wall, nondisplaced fractures of the left maxillary sinus which appears to be filled with blood products based on scan and exam.  I reviewed extensive imaging which was performed in the ED including CT orbit CT max face, and based on appearance of the fractures which are nondisplaced no surgical intervention is warranted at this time.  The patient has significant soft tissue swelling on exam and imaging.  She also has significant ecchymosis.  She is complaining of blurry vision on the left eye and I discussed with her that based on the scan and evidence of significant swelling as well as air within the orbit she needs to see ophthalmology for a dilated eye exam  She reports blood-tinged sputum and I believe based on the scope exam today which included bilateral nasal endoscopy and flexible laryngoscopy that the source of  blood is retained blood products from the left maxillary sinus related to her fall and facial injury.  There was no evidence of active epistaxis or septal hematoma.  Flexible laryngoscopy without evidence of bleeding blood or clot.   - Refer to ophthalmology for a dilated eye exam to assess for any traumatic damage  -No surgical intervention warranted based on appearance of fractures on the scan (nondisplaced) and overall reassuring exam - Recommend Arnica products for bruising. - Advised sinus precautions: avoid using a straw, sneeze with an open mouth, and avoid forcefully clearing or blowing the nose. - Recommend nasal saline sprays to help loosen clots and clear blood from the right nasal passage   Blurry vision Blurry vision in the left eye post-fall, likely due to swelling and/or potential orbital trauma. Ophthalmology consultation is necessary to evaluate further.  - Refer to ophthalmology for a dilated eye exam to assess for any traumatic damage inside the globe.  Epistaxis Blood-tinged secretions in the throat, likely from blood products in the left maxillary sinus due to facial trauma. Expected to clear over time.  - Advised on sinus precautions: avoid using a straw, sneeze with an open mouth, and avoid forcefully clearing or blowing the nose. - Recommend nasal saline sprays to help loosen clots and clear blood from the sinus.  Scalp swelling/hematoma Swelling on the scalp/vertex, 2 cm, likely from the fall, sore but not severely painful. Expected to improve with time and home care. - Apply ice to the affected area to reduce swelling.  Subdural hematoma Small subdural hematoma noted on the scan, unrelated to scalp swelling. Not the primary focus of this visit. - Advised follow-up with primary care provider for further evaluation and management.  Memory problems Difficulty concentrating and potential memory issues post-fall, suggesting a concussion. Further evaluation needed by  primary care provider. - Advised follow-up with primary care provider for further evaluation and management.  Fall risk Fall occurred while feeding a feral cat at night, leading to facial trauma. Advised to avoid similar situations to prevent future falls. - Advised against going outside at night to feed the cat to reduce fall risk.  Pain management Pain managed with acetaminophen. Caution advised to avoid overuse. - Continue using acetaminophen for pain management as needed, but avoid overuse.    Thank you for allowing me to participate in the care of this patient. Please do not hesitate to contact me with any questions or concerns.   Ashok Croon, MD Otolaryngology [facility] Phone: [phone] Fax: [phone]    05/05/2023, 1:53 PM

## 2023-05-05 NOTE — Patient Instructions (Signed)
 Epistaxis prevention instructions given to the patient: - use nasal saline spray x6/day and Vaseline twice a day to prevent nose bleeds - for active nose bleeds use Afrin and nasal tip pressure x 10 min to stop them - if nose bleed does not stop with above measures, please go to Emergency Room  - please see your primary care provider to check your blood pressure and make sure it is under control - return for recurrent nose bleeds and we will consider cautery of your nasal blood vessels  - Purchase BleedStop to have at home and help with epistaxis control

## 2023-05-12 ENCOUNTER — Encounter: Payer: Self-pay | Admitting: Neurology

## 2023-05-12 ENCOUNTER — Ambulatory Visit: Admitting: Neurology

## 2023-05-12 VITALS — BP 136/65 | HR 67 | Ht 62.0 in | Wt 127.0 lb

## 2023-05-12 DIAGNOSIS — S0010XD Contusion of unspecified eyelid and periocular area, subsequent encounter: Secondary | ICD-10-CM | POA: Diagnosis not present

## 2023-05-12 DIAGNOSIS — S0285XA Fracture of orbit, unspecified, initial encounter for closed fracture: Secondary | ICD-10-CM | POA: Insufficient documentation

## 2023-05-12 DIAGNOSIS — S0285XD Fracture of orbit, unspecified, subsequent encounter for fracture with routine healing: Secondary | ICD-10-CM | POA: Diagnosis not present

## 2023-05-12 DIAGNOSIS — F0781 Postconcussional syndrome: Secondary | ICD-10-CM | POA: Diagnosis not present

## 2023-05-12 DIAGNOSIS — S0010XA Contusion of unspecified eyelid and periocular area, initial encounter: Secondary | ICD-10-CM | POA: Insufficient documentation

## 2023-05-12 MED ORDER — DONEPEZIL HCL 5 MG PO TABS: 5.0000 mg | ORAL_TABLET | Freq: Every day | ORAL | 1 refills | Status: DC

## 2023-05-12 NOTE — Patient Instructions (Addendum)
Post-Concussion Syndrome  A concussion is a brain injury from a direct hit to the head or body. This hit causes the brain to shake back and forth fast inside the skull. The shaking can damage brain cells and cause chemical changes in the brain. Concussions are normally not life-threatening but can cause serious symptoms. Post-concussion syndrome is when symptoms that happen after a concussion last longer than normal. These symptoms can last from weeks to months. What are the causes? The cause of this condition is not known. It can happen whether your head injury was mild or severe. What increases the risk? You are more likely to get this condition if: You are female. You are a child, teen, or young adult. You have had a head injury before. You have a history of headaches. You have depression or anxiety. You have more than one symptom or severe symptoms when your concussion occurs. You faint or cannot remember the event (have amnesia of the event). What are the signs or symptoms? Symptoms of this condition include: Physical symptoms, such as: Headaches. Tiredness. Dizziness and weakness. Blurred vision and sensitivity to light. Trouble hearing. Problems with balance. Mental and emotional symptoms, such as: Memory problems and trouble focusing. Trouble falling asleep or staying asleep (insomnia). Feeling irritable. Anxiety or depression. Trouble learning new things. How is this diagnosed? This condition may be diagnosed based on: Your symptoms. A description of your injury. Your medical history. Testing your strength, balance, and nerve function (neurological exam). Your health care provider may order other tests. These may include: Brain imaging, such as a CT scan or an MRI. Memory testing (neuropsychological testing). How is this treated? Treatment for this condition may depend on your symptoms. Symptoms normally go away on their own with time. If you need treatment, it may  include: Medicines for headaches, anxiety, depression, and insomnia. Resting your brain and body for a few days after your injury. Rehab therapy, such as: Physical or occupational therapy. This may include exercises to help with balance and dizziness. Mental health counseling. A form of talk therapy called cognitive behavioral therapy (CBT) can be especially helpful. This therapy helps you set goals and follow up on the changes that you make. Speech therapy. Vision therapy. A brain and eye specialist can recommend treatments for vision problems. Follow these instructions at home: Medicines Take over-the-counter and prescription medicines only as told by your health care provider. Avoid opioid prescription pain medicines when getting over a concussion. Activity Limit your mental activities for the first few days after your injury. This may include not doing these things: Homework or work for your job. Complex thinking. Watching TV. Using a computer or phone. Playing memory games and puzzles. Slowly return to your normal activity level. If a certain activity brings on your symptoms, stop or slow down until you can do the activity without getting symptoms again. Limit physical activity, such as sports or strenuous activities, for the first few days after a concussion. Slowly return to normal activity as told by your health care provider. Light exercise may be helpful. Rest helps your brain heal. Make sure you: Get plenty of sleep at night. Most adults should get at least 7-9 hours of sleep each night. Rest during the day. Take naps or rest breaks when you feel tired. Do not do high-risk activities that could cause a second concussion, such as riding a bike or playing sports. Having another concussion before the first one has healed can be harmful. General instructions  Do  not drink alcohol until your health care provider says that you can. Keep track of how severe your symptoms are and how  often they happen. Give this information to your health care provider. Keep all follow-up visits. Your health care provider may need to check you for new or serious symptoms. Where to find more information [employer]: concussionfoundation.org Contact a health care provider if: Your symptoms do not improve. You get injured again. You have unusual behavior changes. Get help right away if: You have a severe or worsening headache. You have weakness or numbness in any part of your body. You vomit repeatedly. You have mental status changes, such as: Confusion. Trouble speaking. Trouble staying awake. Fainting. You have a seizure. These symptoms may be an emergency. Get help right away. Call 911. Do not wait to see if the symptoms will go away. Do not drive yourself to the hospital. Also, get help right away if: You think about hurting yourself or others. Take one of these steps if you feel like you may hurt yourself or others, or have thoughts about taking your own life: Go to your nearest emergency room. Call 911. Call the National Suicide Prevention Lifeline at 410-806-6033 or 988. This is open 24 hours a day. Text the Crisis Text Line at 443-649-6263. This information is not intended to replace advice given to you by your health care provider. Make sure you discuss any questions you have with your health care provider. Document Revised: 06/06/2021 Document Reviewed: 06/06/2021 Elsevier Patient Education  2024 [employer].

## 2023-05-12 NOTE — Progress Notes (Signed)
 [facility]  Provider:  Dr Wei Newbrough Referring Provider: Ashok Croon, MD Primary Care Physician:  Rodrigo Ran, MD  Chief Complaint  Patient presents with   New Patient (Initial Visit)    Pt alone, rm 1. She had a fall in march and went to ER 3/17. CT was completed which showed a SDH. They consulted and advised to watch it. She has not had repeat imaging. She was having a lot of pressure. Coughing up dried blood. If she is sitting she is ok but when she gets up her head feels swimmy headed.     HPI:   went to ER 3/17. CT was completed which showed a SDH. They consulted and advised to watch it. She has not had repeat imaging. She was having a lot of pressure. Coughing up dried blood. If she is sitting she is ok but when she gets up her head feels swimmy headed.         Beth Burke is a 84 y.o. female and seen here upon referral from ENT  Dr. Irene Pap for a Consultation/ Evaluation of headaches following a mechanical fall on 04-11-2023. She provided a consistent history of events and a picture of her face after the fall, impressive hematoma.   I had seen this patient 10-9  years ago for a sleep consultation referred by Dr Rodrigo Ran.   Beth Burke describes that she fell on concrete after stumbling over a mechanical barrier and she hit the e cemented  ground with her face . She suffered  significant lacerations which were also described in the emergency room and later by Dr. Irene Pap in her ENT progress note.  The last visit with Dr. Irene Pap was on 05-06-2023 patient seen for facial trauma after a fall she had epistaxis and a sensation of head pressure reporting balance difficulties, a feeling of lightheadedness or swimmy headedness.   Dr. Sampson Si tried treated the nosebleeds but she had 2 more nosebleeds after the initial injury.  She used saline nasal spray 3 times a day to help release the scabbing.  The fall resulted in a subdural hematoma and multiple  nondisplaced fractures involving the midface which did not need surgical intervention.   A follow-up CT scan on March 17's the day after the fall showed no progression of the bleeding but she did definitely have a concussion if not contusion.  An eye doctor confirmed no issues with her orbit and her vision post fall has been unaffected.  She has no difficulties with speech or swallowing but she feels, continuously unsteady since the fall.  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  She has been on aspirin but takes no other anticoagulants or blood thinners.  Initially she had some blurry vision and swelling around her left eye which has now improved.  She had a left orbital bottom fracture and left maxillary sinus fracture.  Swelling and bruising have now resolved.  The patient has a past history of anxiety with insomnia, osteoarthritis, knee replacement in 2013, hypercholesterolemia, acid reflux disease-GERD.  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  She also has been s/p abdominal hysterectomy, cervical fusion C5 C7 and she had things goodness no fractures or displacement at the cervical spine.     This patient reports the symptoms of postconcussion syndrome have persisted :  afraid of falling.  G facial pain with palpation at the site of the left orbital bottom and maxillary fracture.  Left temporal  laceration  was treated with glue-  Abrasion  and soft tissue hematomata on the left arm was  reabsorbed.   Subdural hemorrhage follow up with an MRI to be considered no nasal bleeding since last week.     Review of Systems: Out of a complete 14 system review, the patient complains of only the following symptoms, and all other reviewed systems are negative.   Social History   Socioeconomic History    Marital status: Married    Spouse name: Not on file   Number of children: 2   Years of education: Scientist, product/process development   Highest education level: Not on file  Occupational History   Occupation: Semi-Retired    Associate Professor: SELF-EMPLOYED   Occupation: Hairstylist  Tobacco Use   Smoking status: Never   Smokeless tobacco: Never  Vaping Use   Vaping status: Never Used  Substance and Sexual Activity   Alcohol use: No   Drug use: No   Sexual activity: Not Currently    Birth control/protection: Surgical, Post-menopausal  Other Topics Concern   Not on file  Social History Narrative   Not on file   Social Drivers of Health   Financial Resource Strain: Not on file  Food Insecurity: Not on file  Transportation Needs: Not on file  Physical Activity: Not on file  Stress: Not on file  Social Connections: Not on file  Intimate Partner Violence: Not on file    Family History  Problem Relation Age of Onset   Colon polyps Mother    Allergies Mother    Hypertension Mother    Gout Mother    Colon polyps Sister    Cancer - Other Brother    Hypertension Brother    Asthma Brother    Heart attack Maternal Aunt    Colon cancer Neg Hx    Esophageal cancer Neg Hx    Rectal cancer Neg Hx    Stomach cancer Neg Hx     Past Medical History:  Diagnosis Date   Anxiety    Arthritis    Blood transfusion    WITH KNEE REPLACEMENT 01/2011   Elevated cholesterol    GERD (gastroesophageal reflux disease)    Heart murmur    Hypertension    Scarlet fever as child   Sleep apnea    mild sleep apnea, no cpap used   Urinary incontinence    wears pads   Varicose veins    Vertigo     Past Surgical History:  Procedure Laterality Date   ABDOMINAL HYSTERECTOMY  01/26/1982   CERVICAL FUSION  04/27/2010   C5 to C7 with plates--PT HAS LIMITED ROM AND PAIN   COLONOSCOPY WITH ESOPHAGOGASTRODUODENOSCOPY (EGD)     KNEE CLOSED REDUCTION  05/22/2011   Procedure: CLOSED MANIPULATION KNEE;  Surgeon: Aurther Blue, MD;  Location: [facility];  Service: Orthopedics;  Laterality: Right;   right lower leg surgery  01/27/1960   wire placed for crushed leg    TOTAL KNEE ARTHROPLASTY  02/16/2011   Procedure: TOTAL KNEE ARTHROPLASTY;  Surgeon: Aurther Blue, MD;  Location: [facility];  Service: Orthopedics;  Laterality: Right;    Current Outpatient Medications  Medication Sig Dispense Refill   aspirin EC 81 MG tablet Take 81 mg by mouth at bedtime.     benazepril (LOTENSIN) 20 MG tablet Take 20 mg by mouth daily.  Calcium Carbonate-Vit D-Min (CALTRATE 600+D PLUS PO) Take 1 tablet by mouth daily.     Cholecalciferol (VITAMIN D) 2000 UNITS tablet Take 2,000 Units by mouth daily.     Coenzyme Q10 (CO Q-10) 100 MG CAPS Take by mouth.     Multiple Vitamin (MULTIVITAMIN) tablet Take 1 tablet by mouth daily.     rosuvastatin (CRESTOR) 10 MG tablet Take 1 tablet (10 mg total) by mouth at bedtime. 90 tablet 1   sertraline (ZOLOFT) 100 MG tablet Take 100 mg by mouth daily.     sodium chloride (OCEAN) 0.65 % SOLN nasal spray Place 1 spray into both nostrils as needed. 30 mL 5   No current facility-administered medications for this visit.    Allergies as of 05/12/2023 - Review Complete 05/12/2023  Allergen Reaction Noted   Cefdinir Other (See Comments) 02/03/2022   Codeine Nausea And Vomiting and Other (See Comments) 02/27/2009   Escitalopram  09/12/2012   Mirtazapine  11/17/2017   Oxycodone Nausea And Vomiting 05/20/2011   Psyllium  10/30/2013   Raloxifene Other (See Comments) 02/27/2009    Vitals: BP 136/65   Pulse 67   Ht 5\' 2"  (1.575 m)   Wt 127 lb (57.6 kg)   BMI 23.23 kg/m  Last Weight:  Wt Readings from Last 1 Encounters:  05/12/23 127 lb (57.6 kg)   Last Height:   Ht Readings from Last 1 Encounters:  05/12/23 5\' 2"  (1.575 m)   Last BMI: @LASTBMI  Physical exam:  General: The patient is awake, alert and appears not in acute distress.  The patient is well groomed. Head:  Normocephalic, atraumatic.  Neck is supple.   Neck circumference:13 Cardiovascular:  Regular rate and palpable peripheral pulse:  Respiratory: clear to auscultation.  Mallampati 2, Skin:  Without residual evidence of edema, or rash Trunk: normal posture.   Neurologic exam : The patient is awake and alert, oriented to place and time.  Memory subjective  described as impaired  since the accident.  There is a normal attention span & concentration ability.  Speech is fluent without  dysarthria, dysphonia or aphasia.  Mood and affect are appropriate.  Cranial nerves: Pupils are equal and briskly reactive to light. Funduscopic exam without  evidence of pallor or edema. Extraocular movements  in vertical and horizontal without nystagmus.  She described discomfort with gaze to the left and only within the left orbit/ temple- Visual fields by finger perimetry are intact. Hearing to finger rub intact.   Facial sensation now intact to fine touch.  Teeth were sensitive on the left upper left - she has a dental bridge  there- which she wore when she fell.   Facial motor strength is symmetric and tongue and uvula move midline.  Motor exam:   Normal tone and normal muscle bulk and symmetric normal strength in all extremities. Grip Strength equal ! Proximal strength of shoulder muscles and hip flexors was intact .  Sensory:  Fine touch and vibration were tested .  Knee sensation was decreased to vibration, knee is swollen( left side only) Proprioception was tested in the upper extremities only and was  normal.  Coordination: Rapid alternating movements in the fingers/hands were normal.  Finger-to-nose maneuver was tested and showed no evidence of ataxia, dysmetria or tremor.  Gait and station: Patient walked without assistive device - up to the  the fall she never needed a device but she started now to use a cane outside , stopped using it last week- .  Core Strength within normal limits. Stance is  stable and of normal base.   Gait is not slow but unsteady - her left arm does not provide the same amplitude in arm-swing.   Tandem gait is impaired , she turns with 4 Steps are unfragmented.  Romberg testing' swaying , but not propulsive, retropulsion- more leaning to the right.   Deep tendon reflexes: in the  upper and lower extremities are symmetric and  brisk without Clonus. Babinski maneuver response is  downgoing.   Assessment: Total time for face to face interview and examination, for review of  images and laboratory testing, neurophysiology testing and pre-existing records, including out-of -network , was 45 minutes. 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 the injuries suffered explain all the symptoms she described.    Plan:  Treatment plan and additional workup planned after today  includes:   1)   I like for the patient to undergo an MRI - this will help to differentiate axonal CNS damage and edema , and also helps to confirm the re-absorbtion of the SDH.  2)  STM impairment reported , this is highly subjective but a treatment option has been Aricept at 5 mg daily po, for 4 weeks, one refill.    3) OT / PT for left arm ? She reports longstanding shoulder injuries/ wear and tear  to the left  shoulder, grip is preserved. This explains the lesser arm swing . I think that basic PT for gait stabilization is a good idea. I recommended swimming ( as it eliminates the fall injury risk ) but she is a non-swimmer.  I predict a recovery within 6 months since accident.   Follow up prn can be done by virtual visit or with Np at [facility] in 6 months.     INTERPRETING PHYSICIAN:   Neomia Banner, MD  [facility] and [facility] Sleep Board certified by The [employer] of Sleep Medicine and Diplomate of the [employer] of Sleep Medicine. Board certified In  Neurology through the ABPN, Fellow of the [employer] of Neu[employer].

## 2023-05-13 ENCOUNTER — Telehealth: Payer: Self-pay | Admitting: Neurology

## 2023-05-13 NOTE — Telephone Encounter (Signed)
 no auth required sent to [facility] [phone]

## 2023-05-23 ENCOUNTER — Ambulatory Visit
Admission: RE | Admit: 2023-05-23 | Discharge: 2023-05-23 | Disposition: A | Discharge status: Home / Self Care | Source: Ambulatory Visit | Attending: Neurology | Admitting: Neurology

## 2023-05-23 DIAGNOSIS — S0285XD Fracture of orbit, unspecified, subsequent encounter for fracture with routine healing: Secondary | ICD-10-CM

## 2023-05-23 DIAGNOSIS — S0010XD Contusion of unspecified eyelid and periocular area, subsequent encounter: Secondary | ICD-10-CM

## 2023-05-23 DIAGNOSIS — F0781 Postconcussional syndrome: Secondary | ICD-10-CM | POA: Diagnosis not present

## 2023-05-25 ENCOUNTER — Telehealth: Payer: Self-pay | Admitting: Neurology

## 2023-05-25 DIAGNOSIS — F0781 Postconcussional syndrome: Secondary | ICD-10-CM

## 2023-05-25 DIAGNOSIS — S0285XD Fracture of orbit, unspecified, subsequent encounter for fracture with routine healing: Secondary | ICD-10-CM

## 2023-05-25 DIAGNOSIS — S0010XD Contusion of unspecified eyelid and periocular area, subsequent encounter: Secondary | ICD-10-CM

## 2023-05-25 NOTE — Telephone Encounter (Signed)
-----   Message from [place] Dohmeier sent at 05/23/2023  6:01 PM EDT ----- The previously described falx subdural hematoma appears to have resolved.  No other structural lesion, tumor or infarct is noted.  Diffusion-weighted imaging is negative for acute ischemia.  SWI images shows a solitary 5 mm left frontal subcortical microhemorrhage.

## 2023-05-25 NOTE — Telephone Encounter (Signed)
 Called the patient and reviewed the imaging results. She verbalized understanding and had no further questions. Overall she states she still having some residual issues and I advised Dr Albertina Hugger had mentioned some PT to help with post concussion gait concerns and pt was not opposed to that idea if she thinks that will help. Advised I would place the order for the patient.

## 2023-06-03 ENCOUNTER — Other Ambulatory Visit: Payer: Self-pay | Admitting: Neurology

## 2023-08-03 ENCOUNTER — Telehealth: Payer: Self-pay | Admitting: Cardiology

## 2023-08-03 NOTE — Telephone Encounter (Signed)
 Pt is scheduled to have an ECHO done tomorrow but stated she started an antibiotic Doxycycline  and takes it twice a day as advised by Corean Bohr at [facility] due to an infection in her leg. She'd like to know if it's okay to take tomorrow since she has an ECHO? Please advise

## 2023-08-03 NOTE — Telephone Encounter (Signed)
 Returned a call back to the pt.   Pt aware she can proceed with getting her echo done while being on an antibiotic.    Pt verbalized understanding and agrees with this plan.   Pt was more than gracious for all the assistance provided.

## 2023-08-05 ENCOUNTER — Ambulatory Visit (HOSPITAL_COMMUNITY)
Admission: RE | Admit: 2023-08-05 | Discharge: 2023-08-05 | Disposition: A | Discharge status: Home / Self Care | Payer: Medicare Other | Source: Ambulatory Visit | Attending: Internal Medicine | Admitting: Internal Medicine

## 2023-08-05 ENCOUNTER — Other Ambulatory Visit: Payer: Medicare Other

## 2023-08-05 DIAGNOSIS — I351 Nonrheumatic aortic (valve) insufficiency: Secondary | ICD-10-CM | POA: Diagnosis present

## 2023-08-05 DIAGNOSIS — I340 Nonrheumatic mitral (valve) insufficiency: Secondary | ICD-10-CM | POA: Insufficient documentation

## 2023-08-05 LAB — ECHOCARDIOGRAM COMPLETE
Area-P 1/2: 3.16 cm2
P 1/2 time: 511 ms
S' Lateral: 3.7 cm

## 2023-08-12 ENCOUNTER — Ambulatory Visit: Payer: Self-pay | Admitting: Cardiology

## 2023-08-12 DIAGNOSIS — I340 Nonrheumatic mitral (valve) insufficiency: Secondary | ICD-10-CM

## 2023-08-12 NOTE — Progress Notes (Signed)
 Normal heart function. Right sided valve (tricuspid) has moderate to severe leakiness (slightly increased since last echocardiogram in 11/2021). No clinical symptoms related to this. Recommend repeat echocardiogram in 1 year for tricuspid regurgitation (before next visit).  Thanks MJP

## 2023-08-18 ENCOUNTER — Ambulatory Visit: Payer: Self-pay | Admitting: Cardiology

## 2023-09-07 ENCOUNTER — Ambulatory Visit: Admitting: Neurology

## 2023-10-01 ENCOUNTER — Encounter: Payer: Self-pay | Admitting: Cardiology

## 2023-10-01 ENCOUNTER — Ambulatory Visit: Attending: Cardiology | Admitting: Cardiology

## 2023-10-01 VITALS — BP 121/70 | HR 67 | Resp 16 | Ht 62.0 in | Wt 127.8 lb

## 2023-10-01 DIAGNOSIS — R600 Localized edema: Secondary | ICD-10-CM | POA: Diagnosis not present

## 2023-10-01 DIAGNOSIS — I6522 Occlusion and stenosis of left carotid artery: Secondary | ICD-10-CM

## 2023-10-01 DIAGNOSIS — I361 Nonrheumatic tricuspid (valve) insufficiency: Secondary | ICD-10-CM

## 2023-10-01 NOTE — Patient Instructions (Signed)
  Lab Work TODAY: ProBNP  If you have labs (blood work) drawn today and your tests are completely normal, you will receive your results only by: MyChart Message (if you have MyChart) OR A paper copy in the mail If you have any lab test that is abnormal or we need to change your treatment, we will call you to review the results.  Testing/Procedures: Venous Reflux Study   Carotid Duplex  Your physician has requested that you have a carotid duplex. This test is an ultrasound of the carotid arteries in your neck. It looks at blood flow through these arteries that supply the brain with blood. Allow one hour for this exam. There are no restrictions or special instructions.   ECHOCARDIOGRAM IN 6 MONTHS Your physician has requested that you have an echocardiogram. Echocardiography is a painless test that uses sound waves to create images of your heart. It provides your doctor with information about the size and shape of your heart and how well your heart's chambers and valves are working. This procedure takes approximately one hour. There are no restrictions for this procedure. Please do NOT wear cologne, perfume, aftershave, or lotions (deodorant is allowed). Please arrive 15 minutes prior to your appointment time.  Please note: We ask at that you not bring children with you during ultrasound (echo/ vascular) testing. Due to room size and safety concerns, children are not allowed in the ultrasound rooms during exams. Our front office staff cannot provide observation of children in our lobby area while testing is being conducted. An adult accompanying a patient to their appointment will only be allowed in the ultrasound room at the discretion of the ultrasound technician under special circumstances. We apologize for any inconvenience.   Follow-Up: At [facility], you and your health needs are our priority.  As part of our continuing mission to provide you with exceptional heart care, our  providers are all part of one team.  This team includes your primary Cardiologist (physician) and Advanced Practice Providers or APPs (Physician Assistants and Nurse Practitioners) who all work together to provide you with the care you need, when you need it.  Your next appointment:   6 Months   Provider:   Newman JINNY Lawrence, MD    We recommend signing up for the patient portal called MyChart.  Sign up information is provided on this After Visit Summary.  MyChart is used to connect with patients for Virtual Visits (Telemedicine).  Patients are able to view lab/test results, encounter notes, upcoming appointments, etc.  Non-urgent messages can be sent to your provider as well.   To learn more about what you can do with MyChart, go to [email or web address].

## 2023-10-01 NOTE — Progress Notes (Signed)
 Cardiology Office Note:  .   Date:  10/01/2023  ID:  TOMESHIA PIZZI, DOB 03/05/1940, MRN [id number] PCP: Shayne Anes, MD  [facility] Providers Cardiologist:  Newman Lawrence, MD PCP: Shayne Anes, MD  Chief Complaint  Patient presents with   Nonrheumatic aortic valve insufficiency   Follow-up     Beth Burke is a 83 y.o. female with hypertension, hyperlipidemia, moderate carotid artery disease, tricuspid regurgitation    History of Present Illness  Patient is doing well with regular daily activities without any overt complaints of chest pain or shortness of breath.  She does report bilateral leg edema.  She has had varicose veins for long time, but has not tolerated compression stockings well due to skin irritation.  Reviewed recent echocardiogram results with the patient, details.   Vitals:   10/01/23 1332  BP: 121/70  Pulse: 67  Resp: 16  SpO2: 94%      Review of Systems  Cardiovascular:  Positive for leg swelling. Negative for chest pain, dyspnea on exertion, palpitations and syncope.        Studies Reviewed: SABRA        EKG 10/01/2023: Sinus rhythm with occasional Premature ventricular complexes Nonspecific ST abnormality No previous ECGs available   Echocardiogram 07/2023:  1. Left ventricular ejection fraction, by estimation, is 55 to 60%. The  left ventricle has normal function. The left ventricle has no regional  wall motion abnormalities. Left ventricular diastolic parameters are  indeterminate.   2. Right ventricular systolic function is normal. The right ventricular  size is normal. There is normal pulmonary artery systolic pressure. The  estimated right ventricular systolic pressure is 31.3 mmHg.   3. Right atrial size was mildly dilated.   4. The mitral valve is normal in structure. Mild to moderate mitral valve  regurgitation. No evidence of mitral stenosis.   5. Tricuspid valve regurgitation is moderate to severe.   6. The aortic valve  has an indeterminant number of cusps. There is  moderate focal calcification of the aortic valve. Aortic valve  regurgitation is not visualized. Inadequate dopplers obtained; unable to  evaluate for aortic valve stenosis.   7. The inferior vena cava is normal in size with greater than 50%  respiratory variability, suggesting right atrial pressure of 3 mmHg.   Carotid artery duplex 07/2022: Duplex suggests stenosis in the right internal carotid artery (1-15%). Mild homogeneous plaque. Duplex suggests stenosis in the left internal carotid artery (50-69%) with homogeneous plaque. <50% stenosis in the left external carotid artery and mild stenosis in the left CCA with heterogeneous plaque. Antegrade right vertebral artery flow. Antegrade left vertebral artery flow. Compared to the study done on 12/01/2021, no significant change Follow up in six months is appropriate if clinically indicated.   Labs 3-07/2023: Chol 154, TG 69, HDL 87, LDL 53 Hb 10.7 Cr 9.24 TSH 2.4   Physical Exam Vitals and nursing note reviewed.  Constitutional:      General: She is not in acute distress. Neck:     Vascular: No JVD.  Cardiovascular:     Rate and Rhythm: Normal rate and regular rhythm.     Pulses:          Carotid pulses are  on the left side with bruit.    Heart sounds: Murmur heard.     High-pitched blowing holosystolic murmur is present with a grade of 2/6 at the lower right sternal border.  Pulmonary:     Effort: Pulmonary effort is  normal.     Breath sounds: Normal breath sounds. No wheezing or rales.  Musculoskeletal:     Right lower leg: Edema (Trace) present.     Left lower leg: Edema (Trace) present.  Skin:    Comments: Prominent varicosities in both legs      VISIT DIAGNOSES:   ICD-10-CM   1. Nonrheumatic tricuspid valve regurgitation  I36.1 EKG 12-Lead    ECHOCARDIOGRAM COMPLETE    2. Stenosis of left carotid artery  I65.22 VAS US  CAROTID    3. Bilateral leg edema  R60.0  VAS US  LOWER EXTREMITY VENOUS REFLUX    Pro b natriuretic peptide (BNP)       Beth Burke is a 83 y.o. female with hypertension, hyperlipidemia, moderate carotid artery disease, tricuspid regurgitation   Assessment & Plan  Tricuspid regurgitation: Moderate to severe tricuspid regurgitation with normal RV size and systolic function. No known pulmonary history, no known OSA. This may be primary tricuspid regurgitation.  However, given her age and minimal symptoms, recommend conservative management at this time. Repeat echocardiogram in 6 months. Will reevaluate aortic valve on that echocardiogram, and adequately visualized on the study.  Leg edema:  She does have moderate to severe tricuspid regurgitation, leg edema more likely to be due to venous insufficiency. Consider compression stockings, if tolerated, keep legs elevated at night. Will obtain bilateral lower extremity venous insufficiency study.   Will also check proBNP.  Carotid artery disease: Continue aspirin, statin. Will check carotid artery duplex previously noted to have moderate left carotid disease.    Orthostatic vitals negative. Moderate bilateral carotid disease does not explain her symptoms. Consider neurology evaluation, ?vertebrobasilar insufficeicny.   Carotid artery disease: Continue Aspirin, statin. Lipids well controlled.    F/u in 6 months  Signed, Newman JINNY Lawrence, MD

## 2023-10-02 LAB — PRO B NATRIURETIC PEPTIDE: NT-Pro BNP: 136 pg/mL (ref 0–738)

## 2023-10-04 ENCOUNTER — Ambulatory Visit: Payer: Self-pay | Admitting: Cardiology

## 2023-10-04 NOTE — Telephone Encounter (Signed)
 Pt returning call

## 2023-10-04 NOTE — Progress Notes (Signed)
 No evidence of congestive heart failure based on this lab test.  Will await results of venous duplex study to evaluate for venous insufficiency.  Thanks MJP

## 2023-10-12 ENCOUNTER — Other Ambulatory Visit: Payer: Self-pay | Admitting: Cardiology

## 2023-11-08 ENCOUNTER — Ambulatory Visit (HOSPITAL_COMMUNITY)
Admission: RE | Admit: 2023-11-08 | Discharge: 2023-11-08 | Disposition: A | Source: Ambulatory Visit | Attending: Cardiology

## 2023-11-08 ENCOUNTER — Ambulatory Visit (HOSPITAL_COMMUNITY)
Admission: RE | Admit: 2023-11-08 | Discharge: 2023-11-08 | Disposition: A | Discharge status: Home / Self Care | Source: Ambulatory Visit | Attending: Cardiology | Admitting: Cardiology

## 2023-11-08 DIAGNOSIS — I6522 Occlusion and stenosis of left carotid artery: Secondary | ICD-10-CM | POA: Diagnosis present

## 2023-11-08 DIAGNOSIS — R600 Localized edema: Secondary | ICD-10-CM | POA: Diagnosis present

## 2023-11-08 NOTE — Progress Notes (Signed)
 Venous refluz seen in both legs. Recommend regular use of compression stockings. If no improvement, refer to vascular surgery for further management.  Thanks MJP

## 2023-11-08 NOTE — Progress Notes (Signed)
 Mild carotid artery disease. Continue Aspirin, statin.  Thanks MJP

## 2023-11-09 NOTE — Progress Notes (Signed)
Unable to leave message, will try again later.

## 2023-11-23 NOTE — Progress Notes (Signed)
 Over-the-counter may suffice for now.  In future, if no improvement, could use 15-20 mmHg compression.  Thanks MJP

## 2023-11-25 ENCOUNTER — Telehealth: Payer: Self-pay | Admitting: Cardiology

## 2023-11-25 NOTE — Telephone Encounter (Signed)
 Spoke with pt, aware to use the 15-20 mmHG compression hose. She reports that is what she is using and it is working.

## 2023-11-25 NOTE — Progress Notes (Signed)
 Please review telephone encounter

## 2023-11-25 NOTE — Telephone Encounter (Signed)
 Patient stated she was returning RN's call.

## 2023-12-07 ENCOUNTER — Telehealth: Payer: Self-pay | Admitting: Cardiology

## 2023-12-07 NOTE — Telephone Encounter (Signed)
 Patient calling with question on when she needs to have her echo done. Please advise

## 2023-12-07 NOTE — Telephone Encounter (Signed)
 Hey, pt is needing to have echo completed in late February for a 6 month follow up. Do you mind helping me with this?

## 2023-12-08 NOTE — Telephone Encounter (Signed)
 Thank you so much

## 2024-03-21 ENCOUNTER — Ambulatory Visit (HOSPITAL_COMMUNITY)
